# Patient Record
Sex: Female | Born: 1960
Health system: Southern US, Community
[De-identification: ages and names within clinical notes are randomized; demographics above are authoritative.]

## PROBLEM LIST (undated history)

## (undated) DIAGNOSIS — I10 Essential (primary) hypertension: Secondary | ICD-10-CM

## (undated) DIAGNOSIS — J45909 Unspecified asthma, uncomplicated: Secondary | ICD-10-CM

## (undated) DIAGNOSIS — I340 Nonrheumatic mitral (valve) insufficiency: Secondary | ICD-10-CM

## (undated) HISTORY — DX: Unspecified asthma, uncomplicated: J45.909

## (undated) HISTORY — PX: MOUTH SURGERY: SHX715

## (undated) HISTORY — DX: Nonrheumatic mitral (valve) insufficiency: I34.0

---

## 2017-07-10 DIAGNOSIS — J302 Other seasonal allergic rhinitis: Secondary | ICD-10-CM | POA: Insufficient documentation

## 2017-09-07 ENCOUNTER — Encounter (HOSPITAL_COMMUNITY): Payer: Self-pay | Admitting: Emergency Medicine

## 2017-09-07 ENCOUNTER — Inpatient Hospital Stay (HOSPITAL_COMMUNITY)
Admission: EM | Admit: 2017-09-07 | Discharge: 2017-09-10 | DRG: 202 | Disposition: A | Discharge status: Home / Self Care | Payer: No Typology Code available for payment source | Attending: Internal Medicine | Admitting: Internal Medicine

## 2017-09-07 ENCOUNTER — Other Ambulatory Visit: Payer: Self-pay

## 2017-09-07 DIAGNOSIS — R600 Localized edema: Secondary | ICD-10-CM | POA: Diagnosis present

## 2017-09-07 DIAGNOSIS — E669 Obesity, unspecified: Secondary | ICD-10-CM | POA: Diagnosis present

## 2017-09-07 DIAGNOSIS — J9801 Acute bronchospasm: Secondary | ICD-10-CM

## 2017-09-07 DIAGNOSIS — I119 Hypertensive heart disease without heart failure: Secondary | ICD-10-CM | POA: Diagnosis present

## 2017-09-07 DIAGNOSIS — I110 Hypertensive heart disease with heart failure: Secondary | ICD-10-CM | POA: Diagnosis present

## 2017-09-07 DIAGNOSIS — I10 Essential (primary) hypertension: Secondary | ICD-10-CM

## 2017-09-07 DIAGNOSIS — J9601 Acute respiratory failure with hypoxia: Secondary | ICD-10-CM | POA: Diagnosis present

## 2017-09-07 DIAGNOSIS — J209 Acute bronchitis, unspecified: Secondary | ICD-10-CM | POA: Diagnosis present

## 2017-09-07 DIAGNOSIS — K219 Gastro-esophageal reflux disease without esophagitis: Secondary | ICD-10-CM | POA: Diagnosis present

## 2017-09-07 DIAGNOSIS — I2721 Secondary pulmonary arterial hypertension: Secondary | ICD-10-CM | POA: Diagnosis present

## 2017-09-07 DIAGNOSIS — R0602 Shortness of breath: Secondary | ICD-10-CM

## 2017-09-07 DIAGNOSIS — T380X5A Adverse effect of glucocorticoids and synthetic analogues, initial encounter: Secondary | ICD-10-CM | POA: Diagnosis not present

## 2017-09-07 DIAGNOSIS — D72829 Elevated white blood cell count, unspecified: Secondary | ICD-10-CM | POA: Diagnosis not present

## 2017-09-07 DIAGNOSIS — J45901 Unspecified asthma with (acute) exacerbation: Secondary | ICD-10-CM | POA: Diagnosis present

## 2017-09-07 DIAGNOSIS — Z6833 Body mass index (BMI) 33.0-33.9, adult: Secondary | ICD-10-CM

## 2017-09-07 DIAGNOSIS — E876 Hypokalemia: Secondary | ICD-10-CM | POA: Diagnosis present

## 2017-09-07 DIAGNOSIS — Z825 Family history of asthma and other chronic lower respiratory diseases: Secondary | ICD-10-CM

## 2017-09-07 DIAGNOSIS — I5031 Acute diastolic (congestive) heart failure: Secondary | ICD-10-CM | POA: Diagnosis present

## 2017-09-07 HISTORY — DX: Essential (primary) hypertension: I10

## 2017-09-07 NOTE — ED Triage Notes (Signed)
Pt presents from home with cough and shortness of breath that began getting worse tonight. Pt reports to being dx with cold induced asthma and prescribed inhalers. Pt reports no relief with home medications. Audible wheezing noted at time of triage. Pt actively receiving second DuoNeb started by EMS.

## 2017-09-07 NOTE — ED Notes (Signed)
Bed: [id number] Expected date:  Expected time:  Means of arrival:  Comments: SOB, cough

## 2017-09-08 ENCOUNTER — Emergency Department (HOSPITAL_COMMUNITY): Payer: No Typology Code available for payment source

## 2017-09-08 ENCOUNTER — Inpatient Hospital Stay (HOSPITAL_COMMUNITY): Payer: No Typology Code available for payment source

## 2017-09-08 ENCOUNTER — Encounter (HOSPITAL_COMMUNITY): Payer: Self-pay | Admitting: Internal Medicine

## 2017-09-08 DIAGNOSIS — E669 Obesity, unspecified: Secondary | ICD-10-CM | POA: Diagnosis present

## 2017-09-08 DIAGNOSIS — J45901 Unspecified asthma with (acute) exacerbation: Secondary | ICD-10-CM | POA: Diagnosis present

## 2017-09-08 DIAGNOSIS — I119 Hypertensive heart disease without heart failure: Secondary | ICD-10-CM | POA: Diagnosis present

## 2017-09-08 DIAGNOSIS — R600 Localized edema: Secondary | ICD-10-CM | POA: Diagnosis present

## 2017-09-08 DIAGNOSIS — Z6833 Body mass index (BMI) 33.0-33.9, adult: Secondary | ICD-10-CM | POA: Diagnosis not present

## 2017-09-08 DIAGNOSIS — J9601 Acute respiratory failure with hypoxia: Secondary | ICD-10-CM | POA: Diagnosis present

## 2017-09-08 DIAGNOSIS — J209 Acute bronchitis, unspecified: Principal | ICD-10-CM

## 2017-09-08 DIAGNOSIS — J9801 Acute bronchospasm: Secondary | ICD-10-CM | POA: Diagnosis not present

## 2017-09-08 DIAGNOSIS — I10 Essential (primary) hypertension: Secondary | ICD-10-CM | POA: Diagnosis not present

## 2017-09-08 DIAGNOSIS — J201 Acute bronchitis due to Hemophilus influenzae: Secondary | ICD-10-CM | POA: Diagnosis not present

## 2017-09-08 DIAGNOSIS — Z825 Family history of asthma and other chronic lower respiratory diseases: Secondary | ICD-10-CM | POA: Diagnosis not present

## 2017-09-08 DIAGNOSIS — K219 Gastro-esophageal reflux disease without esophagitis: Secondary | ICD-10-CM | POA: Diagnosis present

## 2017-09-08 DIAGNOSIS — I2721 Secondary pulmonary arterial hypertension: Secondary | ICD-10-CM | POA: Diagnosis present

## 2017-09-08 DIAGNOSIS — I5031 Acute diastolic (congestive) heart failure: Secondary | ICD-10-CM | POA: Diagnosis present

## 2017-09-08 DIAGNOSIS — E876 Hypokalemia: Secondary | ICD-10-CM | POA: Diagnosis present

## 2017-09-08 DIAGNOSIS — D72829 Elevated white blood cell count, unspecified: Secondary | ICD-10-CM | POA: Diagnosis not present

## 2017-09-08 DIAGNOSIS — T380X5A Adverse effect of glucocorticoids and synthetic analogues, initial encounter: Secondary | ICD-10-CM | POA: Diagnosis not present

## 2017-09-08 DIAGNOSIS — I110 Hypertensive heart disease with heart failure: Secondary | ICD-10-CM | POA: Diagnosis present

## 2017-09-08 DIAGNOSIS — R0600 Dyspnea, unspecified: Secondary | ICD-10-CM | POA: Diagnosis not present

## 2017-09-08 LAB — BASIC METABOLIC PANEL
ANION GAP: 8 (ref 5–15)
ANION GAP: 9 (ref 5–15)
BUN: 10 mg/dL (ref 6–20)
BUN: 11 mg/dL (ref 6–20)
CALCIUM: 8.8 mg/dL — AB (ref 8.9–10.3)
CHLORIDE: 105 mmol/L (ref 101–111)
CHLORIDE: 105 mmol/L (ref 101–111)
CO2: 25 mmol/L (ref 22–32)
CO2: 26 mmol/L (ref 22–32)
CREATININE: 0.66 mg/dL (ref 0.44–1.00)
Calcium: 9.5 mg/dL (ref 8.9–10.3)
Creatinine, Ser: 0.65 mg/dL (ref 0.44–1.00)
GFR calc Af Amer: >60 mL/min (ref >60)
GFR calc non Af Amer: >60 mL/min (ref >60)
GLUCOSE: 133 mg/dL — AB (ref 65–99)
Glucose, Bld: 252 mg/dL — ABNORMAL HIGH (ref 65–99)
POTASSIUM: 3.2 mmol/L — AB (ref 3.5–5.1)
Potassium: 3.2 mmol/L — ABNORMAL LOW (ref 3.5–5.1)
SODIUM: 139 mmol/L (ref 135–145)
Sodium: 139 mmol/L (ref 135–145)

## 2017-09-08 LAB — BLOOD GAS, ARTERIAL
Acid-base deficit: 2.2 mmol/L — ABNORMAL HIGH (ref 0.0–2.0)
Bicarbonate: 23.5 mmol/L (ref 20.0–28.0)
Drawn by: 232811
FIO2: 21
O2 Saturation: 87.1 %
PATIENT TEMPERATURE: 98
PCO2 ART: 46.1 mmHg (ref 32.0–48.0)
PH ART: 7.326 — AB (ref 7.350–7.450)
PO2 ART: 57.1 mmHg — AB (ref 83.0–108.0)

## 2017-09-08 LAB — CBC WITH DIFFERENTIAL/PLATELET
Basophils Absolute: 0.1 10*3/uL (ref 0.0–0.1)
Basophils Relative: 0 %
EOS PCT: 1 %
Eosinophils Absolute: 0.2 10*3/uL (ref 0.0–0.7)
HEMATOCRIT: 37.9 % (ref 36.0–46.0)
Hemoglobin: 12.1 g/dL (ref 12.0–15.0)
LYMPHS ABS: 1.4 10*3/uL (ref 0.7–4.0)
LYMPHS PCT: 10 %
MCH: 29.1 pg (ref 26.0–34.0)
MCHC: 31.9 g/dL (ref 30.0–36.0)
MCV: 91.1 fL (ref 78.0–100.0)
MONO ABS: 0.6 10*3/uL (ref 0.1–1.0)
MONOS PCT: 4 %
NEUTROS ABS: 11.8 10*3/uL — AB (ref 1.7–7.7)
Neutrophils Relative %: 85 %
PLATELETS: 360 10*3/uL (ref 150–400)
RBC: 4.16 MIL/uL (ref 3.87–5.11)
RDW: 13.6 % (ref 11.5–15.5)
WBC: 14 10*3/uL — ABNORMAL HIGH (ref 4.0–10.5)

## 2017-09-08 LAB — RESPIRATORY PANEL BY PCR
ADENOVIRUS-RVPPCR: NOT DETECTED
Bordetella pertussis: NOT DETECTED
CORONAVIRUS HKU1-RVPPCR: NOT DETECTED
CORONAVIRUS NL63-RVPPCR: NOT DETECTED
CORONAVIRUS OC43-RVPPCR: NOT DETECTED
Chlamydophila pneumoniae: NOT DETECTED
Coronavirus 229E: NOT DETECTED
Influenza A: NOT DETECTED
Influenza B: NOT DETECTED
METAPNEUMOVIRUS-RVPPCR: NOT DETECTED
Mycoplasma pneumoniae: NOT DETECTED
PARAINFLUENZA VIRUS 2-RVPPCR: NOT DETECTED
PARAINFLUENZA VIRUS 3-RVPPCR: NOT DETECTED
Parainfluenza Virus 1: NOT DETECTED
Parainfluenza Virus 4: NOT DETECTED
RHINOVIRUS / ENTEROVIRUS - RVPPCR: NOT DETECTED
Respiratory Syncytial Virus: NOT DETECTED

## 2017-09-08 LAB — CBC
HCT: 34.9 % — ABNORMAL LOW (ref 36.0–46.0)
Hemoglobin: 11.1 g/dL — ABNORMAL LOW (ref 12.0–15.0)
MCH: 28.9 pg (ref 26.0–34.0)
MCHC: 31.8 g/dL (ref 30.0–36.0)
MCV: 90.9 fL (ref 78.0–100.0)
PLATELETS: 345 10*3/uL (ref 150–400)
RBC: 3.84 MIL/uL — AB (ref 3.87–5.11)
RDW: 13.7 % (ref 11.5–15.5)
WBC: 15.8 10*3/uL — AB (ref 4.0–10.5)

## 2017-09-08 LAB — MRSA PCR SCREENING: MRSA BY PCR: NEGATIVE

## 2017-09-08 LAB — I-STAT TROPONIN, ED: Troponin i, poc: 0 ng/mL (ref 0.00–0.08)

## 2017-09-08 LAB — D-DIMER, QUANTITATIVE (NOT AT ARMC): D DIMER QUANT: 2.76 ug{FEU}/mL — AB (ref 0.00–0.50)

## 2017-09-08 LAB — MAGNESIUM: Magnesium: 2.1 mg/dL (ref 1.7–2.4)

## 2017-09-08 LAB — TROPONIN I: Troponin I: <0.03 ng/mL (ref <0.03)

## 2017-09-08 LAB — BRAIN NATRIURETIC PEPTIDE: B Natriuretic Peptide: 60 pg/mL (ref 0.0–100.0)

## 2017-09-08 MED ORDER — BUDESONIDE 0.25 MG/2ML IN SUSP
0.2500 mg | Freq: Two times a day (BID) | RESPIRATORY_TRACT | Status: DC
  Administered 2017-09-08 – 2017-09-09 (×2): 0.25 mg via RESPIRATORY_TRACT
  Filled 2017-09-08 (×3): qty 2

## 2017-09-08 MED ORDER — ALBUTEROL (5 MG/ML) CONTINUOUS INHALATION SOLN
10.0000 mg/h | INHALATION_SOLUTION | RESPIRATORY_TRACT | Status: DC
  Administered 2017-09-08: 10 mg/h via RESPIRATORY_TRACT
  Filled 2017-09-08: qty 20

## 2017-09-08 MED ORDER — IOPAMIDOL (ISOVUE-370) INJECTION 76%
INTRAVENOUS | Status: AC
  Administered 2017-09-08: 100 mL via INTRAVENOUS
  Filled 2017-09-08: qty 100

## 2017-09-08 MED ORDER — METHYLPREDNISOLONE SODIUM SUCC 40 MG IJ SOLR
40.0000 mg | Freq: Two times a day (BID) | INTRAMUSCULAR | Status: DC
  Administered 2017-09-08 (×2): 40 mg via INTRAVENOUS
  Filled 2017-09-08 (×3): qty 1

## 2017-09-08 MED ORDER — ACETAMINOPHEN 325 MG PO TABS: 650.0000 mg | ORAL_TABLET | Freq: Four times a day (QID) | ORAL | Status: DC | PRN

## 2017-09-08 MED ORDER — ONDANSETRON HCL 4 MG/2ML IJ SOLN
4.0000 mg | Freq: Four times a day (QID) | INTRAMUSCULAR | Status: DC | PRN
  Administered 2017-09-08: 4 mg via INTRAVENOUS
  Filled 2017-09-08: qty 2

## 2017-09-08 MED ORDER — IOPAMIDOL (ISOVUE-370) INJECTION 76%
100.0000 mL | Freq: Once | INTRAVENOUS | Status: AC | PRN
  Administered 2017-09-08: 100 mL via INTRAVENOUS

## 2017-09-08 MED ORDER — ALBUTEROL SULFATE (2.5 MG/3ML) 0.083% IN NEBU
2.5000 mg | INHALATION_SOLUTION | RESPIRATORY_TRACT | Status: DC | PRN
  Administered 2017-09-08: 2.5 mg via RESPIRATORY_TRACT
  Filled 2017-09-08: qty 3

## 2017-09-08 MED ORDER — ALBUTEROL SULFATE (2.5 MG/3ML) 0.083% IN NEBU
2.5000 mg | INHALATION_SOLUTION | Freq: Four times a day (QID) | RESPIRATORY_TRACT | Status: DC
  Filled 2017-09-08: qty 3

## 2017-09-08 MED ORDER — LORAZEPAM 2 MG/ML IJ SOLN
0.5000 mg | Freq: Once | INTRAMUSCULAR | Status: DC
  Filled 2017-09-08: qty 1

## 2017-09-08 MED ORDER — METOPROLOL TARTRATE 5 MG/5ML IV SOLN
5.0000 mg | Freq: Four times a day (QID) | INTRAVENOUS | Status: DC | PRN
Start: 2017-09-08 — End: 2017-09-10

## 2017-09-08 MED ORDER — METHYLPREDNISOLONE SODIUM SUCC 125 MG IJ SOLR
125.0000 mg | Freq: Once | INTRAMUSCULAR | Status: AC
  Administered 2017-09-08: 125 mg via INTRAVENOUS
  Filled 2017-09-08: qty 2

## 2017-09-08 MED ORDER — BENZONATATE 100 MG PO CAPS
100.0000 mg | ORAL_CAPSULE | Freq: Three times a day (TID) | ORAL | Status: DC | PRN
  Administered 2017-09-09 (×2): 100 mg via ORAL
  Filled 2017-09-08 (×2): qty 1

## 2017-09-08 MED ORDER — LISINOPRIL 20 MG PO TABS
20.0000 mg | ORAL_TABLET | Freq: Every day | ORAL | Status: DC
  Administered 2017-09-08 – 2017-09-10 (×3): 20 mg via ORAL
  Filled 2017-09-08 (×2): qty 1
  Filled 2017-09-08 (×2): qty 2
  Filled 2017-09-08: qty 1
  Filled 2017-09-08: qty 2

## 2017-09-08 MED ORDER — LORAZEPAM 2 MG/ML IJ SOLN
1.0000 mg | INTRAMUSCULAR | Status: DC | PRN
  Administered 2017-09-09: 1 mg via INTRAVENOUS
  Filled 2017-09-08: qty 1

## 2017-09-08 MED ORDER — DEXTROSE 5 % IV SOLN
500.0000 mg | INTRAVENOUS | Status: DC
  Administered 2017-09-08 – 2017-09-10 (×3): 500 mg via INTRAVENOUS
  Filled 2017-09-08 (×2): qty 500

## 2017-09-08 MED ORDER — LORAZEPAM 2 MG/ML IJ SOLN
0.5000 mg | Freq: Once | INTRAMUSCULAR | Status: AC
  Administered 2017-09-08: 0.5 mg via INTRAVENOUS

## 2017-09-08 MED ORDER — POTASSIUM CHLORIDE CRYS ER 20 MEQ PO TBCR
40.0000 meq | EXTENDED_RELEASE_TABLET | Freq: Once | ORAL | Status: AC
  Administered 2017-09-08: 40 meq via ORAL
  Filled 2017-09-08: qty 2

## 2017-09-08 MED ORDER — LEVALBUTEROL HCL 0.63 MG/3ML IN NEBU
0.6300 mg | INHALATION_SOLUTION | Freq: Four times a day (QID) | RESPIRATORY_TRACT | Status: DC
  Administered 2017-09-08 – 2017-09-09 (×4): 0.63 mg via RESPIRATORY_TRACT
  Filled 2017-09-08 (×5): qty 3

## 2017-09-08 MED ORDER — ALBUTEROL SULFATE (2.5 MG/3ML) 0.083% IN NEBU: 2.5000 mg | INHALATION_SOLUTION | RESPIRATORY_TRACT | Status: DC

## 2017-09-08 MED ORDER — ACETAMINOPHEN 650 MG RE SUPP
650.0000 mg | Freq: Four times a day (QID) | RECTAL | Status: DC | PRN
Start: 2017-09-08 — End: 2017-09-10

## 2017-09-08 MED ORDER — MAGNESIUM SULFATE 2 GM/50ML IV SOLN
2.0000 g | Freq: Once | INTRAVENOUS | Status: AC
  Administered 2017-09-08: 2 g via INTRAVENOUS
  Filled 2017-09-08: qty 50

## 2017-09-08 MED ORDER — ONDANSETRON HCL 4 MG PO TABS
4.0000 mg | ORAL_TABLET | Freq: Four times a day (QID) | ORAL | Status: DC | PRN
Start: 2017-09-08 — End: 2017-09-10

## 2017-09-08 MED ORDER — HYDRALAZINE HCL 20 MG/ML IJ SOLN
10.0000 mg | INTRAMUSCULAR | Status: DC | PRN
  Administered 2017-09-08: 10 mg via INTRAVENOUS
  Filled 2017-09-08: qty 1

## 2017-09-08 MED ORDER — ENOXAPARIN SODIUM 40 MG/0.4ML ~~LOC~~ SOLN
40.0000 mg | SUBCUTANEOUS | Status: DC
  Administered 2017-09-09 – 2017-09-10 (×2): 40 mg via SUBCUTANEOUS
  Filled 2017-09-08 (×2): qty 0.4

## 2017-09-08 NOTE — ED Notes (Signed)
Assigned 1230 @ 3:51

## 2017-09-08 NOTE — ED Notes (Signed)
Pt returned from restroom via steady and still very anxious and pt reporting feeling jittery. Dr.Pollina at bedside and reordered Ativan 0.5mg  IVP for anxiety. Pt also placed on 2L O2 via [place] to help with anxiety.

## 2017-09-08 NOTE — ED Notes (Signed)
Report given to [place]Kara on 2W. Pt will be transported following CT

## 2017-09-08 NOTE — Progress Notes (Addendum)
Pt admitted after midnight, please see earlier admission note by Dr. Toniann FailKakrakandy. Patient admitted with acute respiratory failure with hypoxia. Admitted to SDU, PCCM consulted. Currently feels better. CT angio pending, r/o PE. Contineu bronchodilators scheduled as needed, solumedrol 40 mg IV BID, Zithromax 500 mg IV QD. RVP also added.   Debbora PrestoMAGICK-Lenee Franze, MD  [facility] Pager [phone]  If 7PM-7AM, please contact night-coverage [email or web address] Password [email or web address]

## 2017-09-08 NOTE — ED Notes (Addendum)
Pt placed on hospital bed from ER stretcher. Dr.Kakrakandy notified of pt still having difficulty breathing and anxiety.

## 2017-09-08 NOTE — ED Notes (Signed)
Pt still reporting shortness of breath. After results of ABG pt placed on O2 via [place] at 3L. Dr.Kakrakandy aware.

## 2017-09-08 NOTE — Progress Notes (Signed)
This RT placed pt on 3lnc post abg.  Results show PO2=57, RN notified.

## 2017-09-08 NOTE — Consult Note (Signed)
.. ..  Name: Jillian Jefferson MRN: [id number] DOB: 12/23/1960    ADMISSION DATE:  09/07/2017 CONSULTATION DATE:  09/08/17   REFERRING MD :  Toniann FailKakrakandy MD   CHIEF COMPLAINT:  Shortness of Breath   BRIEF PATIENT DESCRIPTION: 56 yr old female with PMHx of HTN presents with   SIGNIFICANT EVENTS   STUDIES:  CXR ABG  HISTORY OF PRESENT ILLNESS:     56 yr old female never smoker with PMHx of HTN started Norvasc recently (a week ago) and within one day started to notice breathing difficulty. She saw a primary care doctor for this problem who continued her norvasc and added an inhaler which provided no relief so the patient stopped the norvasc. She does not use home oxygen at baseline and denies any history of breathing problems prior to this event. She states that three days ago the breathing got much worse. She admits to being immobile for several days due to the inclement weather. She has not had any recent surgeries and denies leg pain, fever and previous history of clotting/bleeding disorders and malignancy.  She does state that her shortness of breath is accompanied by productive cough ( yellowish sputum sometimes blood streaked) Chills but no documented fevers at home.   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  has a past medical history of Hypertension.  has a past surgical history that includes Mouth surgery. Prior to Admission medications   Medication Sig Start Date End Date Taking? Authorizing Provider  albuterol (PROVENTIL HFA;VENTOLIN HFA) 108 (90 Base) MCG/ACT inhaler Inhale 2 puffs into the lungs every 6 (six) hours as needed for wheezing or shortness of breath.   Yes [provider]  azithromycin (ZITHROMAX) 250 MG tablet Take 250 mg by mouth daily. 09/04/17 09/10/17 Yes [provider]  cetirizine (ZYRTEC) 10 MG  tablet Take 10 mg by mouth daily.   Yes [provider]  cyclobenzaprine (FLEXERIL) 10 MG tablet Take 10 mg by mouth 3 (three) times daily as needed for muscle spasms.   Yes [provider]  Glycopyrrolate-Formoterol (BEVESPI AEROSPHERE) 9-4.8 MCG/ACT AERO Inhale 2 puffs into the lungs 2 (two) times daily.   Yes [provider]  ibuprofen (ADVIL,MOTRIN) 800 MG tablet Take 800 mg by mouth every 8 (eight) hours as needed for moderate pain.   Yes [provider]  lisinopril-hydrochlorothiazide (PRINZIDE,ZESTORETIC) 20-12.5 MG tablet Take 1 tablet by mouth daily.   Yes [provider]  Multiple Vitamin (MULTIVITAMIN WITH MINERALS) TABS tablet Take 1 tablet by mouth daily.   Yes [provider]  OVER THE COUNTER MEDICATION Take 1 capsule by mouth 3 (three) times daily. Bio-X4   Yes [provider]   No Known Allergies  FAMILY HISTORY:  family history includes Hypertension in her other.  Mother and Aunt both Breast CA  SOCIAL HISTORY:  reports that  has never smoked. she has never used smokeless tobacco. She reports that she does not drink alcohol or use drugs.  REVIEW OF SYSTEMS:  Bolded items are pertinent positives Constitutional: Negative for fever, chills, weight loss, malaise/fatigue and diaphoresis.  HENT: Negative for hearing loss, ear paain, nosebleeds, congestion, sore throat, neck pain, tinnitus and ear discharge.   Eyes: Negative for blurred vision, double vision, photophobia, pain, discharge and redness.  Respiratory: Negative for cough, hemoptysis, sputum production, shortness of breath, wheezing  and stridor.   Cardiovascular: Negative for chest pain, palpitations, orthopnea, claudication, leg swelling and PND.  Gastrointestinal: Negative for heartburn, nausea, vomiting, abdominal pain, diarrhea, constipation, blood in stool and melena.  Genitourinary: Negative for dysuria, urgency, frequency, hematuria and flank pain.    Musculoskeletal: Negative for myalgias, back pain, joint pain and falls.  Skin: Negative for itching and rash.  Neurological: Negative for dizziness, tingling, tremors, sensory change, speech change, focal weakness, seizures, loss of consciousness, weakness and headaches.  Endo/Heme/Allergies: Negative for environmental allergies and polydipsia. Does not bruise/bleed easily.  SUBJECTIVE:   VITAL SIGNS: Temp:  [98 F (36.7 C)] 98 F (36.7 C) (12/10 2348) Pulse Rate:  [109-137] 124 (12/11 0600) Resp:  [12-51] 22 (12/11 0600) BP: (139-193)/(68-108) 183/96 (12/11 0600) SpO2:  [91 %-100 %] 99 % (12/11 0600) Weight:  [86.2 kg (190 lb)] 86.2 kg (190 lb) (12/10 2351)  PHYSICAL EXAMINATION: General:  Alert in mild discomfort Neuro:  AAOx3 no focal deficits appreciated   HEENT:  NCAT PERRLA Cardiovascular:  S1 and S2 tachy  Lungs:  Coarse rhonic esp in posterior fields with exp wheeze appreciated  Abdomen:  Soft non tender + BS  Musculoskeletal:  +2 edema pitting in lower ext Skin:  intact with no rash  Recent Labs  Lab 09/08/17 0001 09/08/17 0508  NA 139 139  K 3.2* 3.2*  CL 105 105  CO2 26 25  BUN 11 10  CREATININE 0.65 0.66  GLUCOSE 133* 252*   Recent Labs  Lab 09/08/17 0001 09/08/17 0508  HGB 12.1 11.1*  HCT 37.9 34.9*  WBC 14.0* 15.8*  PLT 360 345   Dg Chest Port 1 View  Result Date: 09/08/2017 CLINICAL DATA:  56 y/o  F; shortness of breath, cough, wheezing. EXAM: PORTABLE CHEST 1 VIEW COMPARISON:  11/04/2016 chest radiograph FINDINGS: Stable heart size and mediastinal contours are within normal limits. Both lungs are clear. The visualized skeletal structures are unremarkable. IMPRESSION: No active disease. Electronically Signed   By: Mitzi HansenLance  Furusawa-Stratton M.D.   On: 09/08/2017 00:57    ASSESSMENT / PLAN: 56 year old female with one week history of dyspnea. Consulted for PCCM evaluation Pt is hemodynamically stable and currently requiring 4L [place] of  O2   Recommendations: Hypoxia/Hypoxemia Concerning as it is new Titrate O2 to keep Sats >92% If at 8-10L consider HFNC switch bronchodilator to Xopenex due to tachycardia  Given acute nature, immobility, tachycardia and desaturation Concern for PE Sent for D-dimer as Well's score intermediate probability Came back elevated at 2.76 recommend CT PE w/ and w/o contrast  Lower extremity pitting edema concerning BNP was only 60 ( can be inaccurate in obese patients) 2D ECHO given h/o uncontrolled HTN Pt was taking OTC cold meds which may be blamed as reason for elevated BP  R/O Infectious etiology Bronchospasm noted on exam On proventil but should be switched to xopenex due to HR Resp viral panel  Sputum and blood cx sent    At this time she does not meet criteria for the ICU But if her condition worsens please feel free to reconsult   I, Dr Newell CoralKristen Stashia Sia have personally reviewed patient's available data, including medical history, events of note, physical examination and test results as part of my evaluation. I have discussed with care providers Hospitalist, Elink and RN The patient's condition requires high complexity decision making for assessment and support, frequent evaluation and titration of therapies, application of advanced monitoring technologies and extensive interpretation of multiple databases.  This critical care time of 45 mins does not reflect procedure time, or teaching time or supervisory time of PA but could involve care discussion time    CC TIME: 45 mins PROGNOSIS: Guarded FAMILY: Husband CODE STATUS: Full DISPOSITION: Hospitalist Stepdown   Signed Dr Newell Coral Pulmonary Critical Care Locums Pulmonary and Critical Care Medicine [facility] Pager: [phone]  09/08/2017, 6:21 AM

## 2017-09-08 NOTE — ED Notes (Signed)
Pt requesting medication to aid with anxiety. Provider to be notified.

## 2017-09-08 NOTE — ED Provider Notes (Signed)
Patient presented to the ER with shortness of breath.  Face to face Exam: HEENT - PERRLA Lungs -tachypnea, bilateral wheezing Heart - RRR, no M/R/G Abd - S/NT/ND Neuro - alert, oriented x3  Plan: Patient with history of asthma presents to the emergency department with increased shortness of breath and wheezing.  She has only partially responded to maximum therapy, will hospitalize for further management.   Gilda CreasePollina, Angelic Schnelle J, MD 09/08/17 0157

## 2017-09-08 NOTE — ED Notes (Signed)
Per hospitalist, albuterol is to be held at this time due to bp and hr

## 2017-09-08 NOTE — ED Notes (Signed)
Pt still exhibiting shortness of breath and wheezing at this time. Pt reports some ease after receiving Ativan earlier.

## 2017-09-08 NOTE — ED Notes (Signed)
Dr.Scatliffe at bedside to evaluate pt.

## 2017-09-08 NOTE — ED Provider Notes (Signed)
[facility]-EMERGENCY DEPT Provider Note   CSN: [id number] Arrival date & time: 09/07/17  2338     History   Chief Complaint Chief Complaint  Patient presents with  . Shortness of Breath    HPI Jillian Jefferson is a 56 y.o. female.  The history is provided by the patient and medical records.  Shortness of Breath  Associated symptoms include cough.    56 y.o. F with hx of HTN, presenting to the ED for SOB.  Patient has been sick for about a week now, saw her doctor 2 days ago and was started on azithromycin and some home inhalers.  She was diagnosed with cold induced asthma.  Husband reports she got worse about 3 hours prior to arrival.  She has had 2 duonebs with EMS without much change.  Continues to feel SOB with wet but non-productive cough.  No fever, chills.  No known sick contacts.  Did recently start taking norvasc recently as well-- prescribed about 3 weeks ago by her PCP.  Past Medical History:  Diagnosis Date  . Hypertension     There are no active problems to display for this patient.   Past Surgical History:  Procedure Laterality Date  . MOUTH SURGERY      OB History    No data available       Home Medications    Prior to Admission medications   Not on File    Family History History reviewed. No pertinent family history.  Social History Social History   Tobacco Use  . Smoking status: Never Smoker  . Smokeless tobacco: Never Used  Substance Use Topics  . Alcohol use: No    Frequency: Never  . Drug use: No     Allergies   Patient has no known allergies.   Review of Systems Review of Systems  Respiratory: Positive for cough and shortness of breath.   All other systems reviewed and are negative.    Physical Exam Updated Vital Signs BP (!) 187/85 (BP Location: Right Arm)   Pulse (!) 117   Temp 98 F (36.7 C) (Oral)   Resp 12   Ht 5\' 4"  (1.626 m)   Wt 86.2 kg (190 lb)   SpO2 100%   BMI 32.61 kg/m    Physical Exam  Constitutional: She is oriented to person, place, and time. She appears well-developed and well-nourished.  HENT:  Head: Normocephalic and atraumatic.  Mouth/Throat: Oropharynx is clear and moist.  Airway clear, no stridor  Eyes: Conjunctivae and EOM are normal. Pupils are equal, round, and reactive to light.  Neck: Normal range of motion.  Cardiovascular: Regular rhythm and normal heart sounds. Tachycardia present.  Pulmonary/Chest: Accessory muscle usage present. Tachypnea noted. She has wheezes. She has no rales.  tachypneic with increased work of breathing, increased effort with accessory muscle use; retractions present; diffuse inspiratory and expiratory wheezes throughout with some intermixed rhonchi; speaking in very short, truncated sentences  Abdominal: Soft. Bowel sounds are normal.  Musculoskeletal: Normal range of motion.  Neurological: She is alert and oriented to person, place, and time.  Skin: Skin is warm and dry.  Psychiatric: She has a normal mood and affect.  Nursing note and vitals reviewed.    ED Treatments / Results  Labs (all labs ordered are listed, but only abnormal results are displayed) Labs Reviewed  CBC WITH DIFFERENTIAL/PLATELET - Abnormal; Notable for the following components:      Result Value   WBC 14.0 (*)  Neutro Abs 11.8 (*)    All other components within normal limits  BASIC METABOLIC PANEL - Abnormal; Notable for the following components:   Potassium 3.2 (*)    Glucose, Bld 133 (*)    All other components within normal limits  HIV ANTIBODY (ROUTINE TESTING)  BASIC METABOLIC PANEL  CBC  CBC  CREATININE, SERUM  I-STAT TROPONIN, ED    EKG  EKG Interpretation  Date/Time:  Monday September 07 2017 23:54:02 EST Ventricular Rate:  117 PR Interval:    QRS Duration: 85 QT Interval:  336 QTC Calculation: 469 R Axis:   -49 Text Interpretation:  Sinus tachycardia Left anterior fascicular block Abnormal R-wave  progression, early transition No previous tracing Confirmed by Gilda CreasePollina, Christopher J [phone]) on 09/08/2017 12:14:15 AM       Radiology Dg Chest Port 1 View  Result Date: 09/08/2017 CLINICAL DATA:  56 y/o  F; shortness of breath, cough, wheezing. EXAM: PORTABLE CHEST 1 VIEW COMPARISON:  11/04/2016 chest radiograph FINDINGS: Stable heart size and mediastinal contours are within normal limits. Both lungs are clear. The visualized skeletal structures are unremarkable. IMPRESSION: No active disease. Electronically Signed   By: Mitzi HansenLance  Furusawa-Stratton M.D.   On: 09/08/2017 00:57    Procedures Procedures (including critical care time)  CRITICAL CARE Performed by: Garlon HatchetLisa M Akeen Ledyard   Total critical care time: 45 minutes  Critical care time was exclusive of separately billable procedures and treating other patients.  Critical care was necessary to treat or prevent imminent or life-threatening deterioration.  Critical care was time spent personally by me on the following activities: development of treatment plan with patient and/or surrogate as well as nursing, discussions with consultants, evaluation of patient's response to treatment, examination of patient, obtaining history from patient or surrogate, ordering and performing treatments and interventions, ordering and review of laboratory studies, ordering and review of radiographic studies, pulse oximetry and re-evaluation of patient's condition.   Medications Ordered in ED Medications  acetaminophen (TYLENOL) tablet 650 mg (not administered)    Or  acetaminophen (TYLENOL) suppository 650 mg (not administered)  ondansetron (ZOFRAN) tablet 4 mg (not administered)    Or  ondansetron (ZOFRAN) injection 4 mg (not administered)  enoxaparin (LOVENOX) injection 40 mg (not administered)  albuterol (PROVENTIL) (2.5 MG/3ML) 0.083% nebulizer solution 2.5 mg (not administered)  budesonide (PULMICORT) nebulizer solution 0.25 mg (not administered)   methylPREDNISolone sodium succinate (SOLU-MEDROL) 40 mg/mL injection 40 mg (not administered)  albuterol (PROVENTIL) (2.5 MG/3ML) 0.083% nebulizer solution 2.5 mg (not administered)  methylPREDNISolone sodium succinate (SOLU-MEDROL) 125 mg/2 mL injection 125 mg (125 mg Intravenous Given 09/08/17 0026)  magnesium sulfate IVPB 2 g 50 mL (0 g Intravenous Stopped 09/08/17 0125)  potassium chloride SA (K-DUR,KLOR-CON) CR tablet 40 mEq (40 mEq Oral Given 09/08/17 0151)  LORazepam (ATIVAN) injection 0.5 mg (0.5 mg Intravenous Given 09/08/17 0156)     Initial Impression / Assessment and Plan / ED Course  I have reviewed the triage vital signs and the nursing notes.  Pertinent labs & imaging results that were available during my care of the patient were reviewed by me and considered in my medical decision making (see chart for details).  56 year old female here with shortness of breath.  Recent URI type symptoms, started on azithromycin but also diagnosed with cold-induced asthma.  Has not had any relief with home inhaler.  On initial exam patient is tachypneic, tachycardic, with labored breathing.  She has diffuse inspiratory and expiratory wheezes.  Currently on second DuoNeb.  Will start on hour-long continuous neb, give IV Solu-Medrol and magnesium.  Plan for basic labs, chest x-ray.  1:29 AM Patient has completed an hour long neb--minor improvement.  She still has diffuse expiratory wheezes.  States she is now feeling somewhat panicky because of her breathing as she has never experienced anything like this in the past.  She still appears labored.  She only has access to albuterol inhaler at home, no nebs.  She will require admission for ongoing treatments and likely steroids.  She was given small dose ativan for anxiety.  Discussed labs and CXR with patient and family, they are in agreement with admission.  Discussed with Dr. Toniann FailKakrakandy-- will admit for ongoing care.  Final Clinical Impressions(s)  / ED Diagnoses   Final diagnoses:  Shortness of breath  Bronchospasm    ED Discharge Orders    None       Garlon HatchetSanders, Rasheedah Reis M, PA-C 09/08/17 0202    Gilda CreasePollina, Christopher J, MD 09/08/17 [phone]

## 2017-09-08 NOTE — Progress Notes (Signed)
[facility] PCCM  Breathing has improved Boyfriend tells me she has been sick for two weeks, been in bed for about the last 48 hours.  Agree with CT angiogram Will add resp viral panel  Heber CarolinaBrent Gurkaran Rahm, MD [place] [facility] Pager: [phone] Cell: [phone] After 3pm or if no response, call [phone]

## 2017-09-08 NOTE — H&P (Signed)
History and Physical    Jillian MarionKathy Vila ZOX:[id number] DOB: 09-01-1961 DOA: 09/07/2017  PCP: No primary care provider on file.  Patient coming from: Home.  Chief Complaint: Shortness of breath.  HPI: Jillian Jefferson is a 56 y.o. female with history of hypertension presents with complaints of persistent cough and shortness of breath.  Patient states that the cough started 2 weeks ago and had gone to her PCP and was prescribed inhalers.  At the time patient blood pressure was found to be elevated and patient was started on amlodipine as per the patient.  Since then patient's cough has been progressively getting worse and with increasing wheezing and shortness of breath.  Denies any chest pain.  Patient had gone to her PCP again 3 days ago and was prescribed antibiotics.  Despite taking which patient shortness of breath and cough did not improve and patient came to the ER.  ED Course: In the ER patient is found to be wheezing bilaterally chest x-ray was unremarkable.  Patient was started on nebulizer treatment and steroids admitted for acute respiratory failure with hypoxia likely from bronchitis.  Review of Systems: As per HPI, rest all negative.   Past Medical History:  Diagnosis Date  . Hypertension     Past Surgical History:  Procedure Laterality Date  . MOUTH SURGERY       reports that  has never smoked. she has never used smokeless tobacco. She reports that she does not drink alcohol or use drugs.  No Known Allergies  Family History  Problem Relation Age of Onset  . Hypertension Other     Prior to Admission medications   Not on File    Physical Exam: Vitals:   09/08/17 0000 09/08/17 0013 09/08/17 0100 09/08/17 0115  BP: (!) 174/85  (!) 173/88   Pulse: (!) 122   (!) 137  Resp: (!) 22  (!) 24 (!) 22  Temp:      TempSrc:      SpO2: 100% 97%  96%  Weight:      Height:          Constitutional: Moderately built and nourished. Vitals:   09/08/17 0000 09/08/17 0013  09/08/17 0100 09/08/17 0115  BP: (!) 174/85  (!) 173/88   Pulse: (!) 122   (!) 137  Resp: (!) 22  (!) 24 (!) 22  Temp:      TempSrc:      SpO2: 100% 97%  96%  Weight:      Height:       Eyes: Anicteric no pallor. ENMT: No discharge from the ears eyes nose or mouth. Neck: No mass felt.  No neck rigidity. Respiratory: Bilateral expiratory wheeze heard no crepitations. Cardiovascular: S1-S2 heard no murmurs appreciated. Abdomen: Soft nontender bowel sounds present. Musculoskeletal: No edema.  No joint effusion. Skin: No rash.  Skin appears warm. Neurologic: Alert awake oriented to time place and person.  Moves all extremities. Psychiatric: Appears normal.  Normal affect.   Labs on Admission: I have personally reviewed following labs and imaging studies  CBC: Recent Labs  Lab 09/08/17 0001  WBC 14.0*  NEUTROABS 11.8*  HGB 12.1  HCT 37.9  MCV 91.1  PLT 360   Basic Metabolic Panel: Recent Labs  Lab 09/08/17 0001  NA 139  K 3.2*  CL 105  CO2 26  GLUCOSE 133*  BUN 11  CREATININE 0.65  CALCIUM 9.5   GFR: Estimated Creatinine Clearance: 85.4 mL/min (by C-G formula based on SCr of  0.65 mg/dL). Liver Function Tests: No results for input(s): AST, ALT, ALKPHOS, BILITOT, PROT, ALBUMIN in the last 168 hours. No results for input(s): LIPASE, AMYLASE in the last 168 hours. No results for input(s): AMMONIA in the last 168 hours. Coagulation Profile: No results for input(s): INR, PROTIME in the last 168 hours. Cardiac Enzymes: No results for input(s): CKTOTAL, CKMB, CKMBINDEX, TROPONINI in the last 168 hours. BNP (last 3 results) No results for input(s): PROBNP in the last 8760 hours. HbA1C: No results for input(s): HGBA1C in the last 72 hours. CBG: No results for input(s): GLUCAP in the last 168 hours. Lipid Profile: No results for input(s): CHOL, HDL, LDLCALC, TRIG, CHOLHDL, LDLDIRECT in the last 72 hours. Thyroid Function Tests: No results for input(s): TSH, T4TOTAL,  FREET4, T3FREE, THYROIDAB in the last 72 hours. Anemia Panel: No results for input(s): VITAMINB12, FOLATE, FERRITIN, TIBC, IRON, RETICCTPCT in the last 72 hours. Urine analysis: No results found for: COLORURINE, APPEARANCEUR, LABSPEC, PHURINE, GLUCOSEU, HGBUR, BILIRUBINUR, KETONESUR, PROTEINUR, UROBILINOGEN, NITRITE, LEUKOCYTESUR Sepsis Labs: @LABRCNTIP (procalcitonin:4,lacticidven:4) )No results found for this or any previous visit (from the past 240 hour(s)).   Radiological Exams on Admission: Dg Chest Port 1 View  Result Date: 09/08/2017 CLINICAL DATA:  56 y/o  F; shortness of breath, cough, wheezing. EXAM: PORTABLE CHEST 1 VIEW COMPARISON:  11/04/2016 chest radiograph FINDINGS: Stable heart size and mediastinal contours are within normal limits. Both lungs are clear. The visualized skeletal structures are unremarkable. IMPRESSION: No active disease. Electronically Signed   By: Mitzi HansenLance  Furusawa-Stratton M.D.   On: 09/08/2017 00:57    EKG: Independently reviewed.  Sinus tachycardia.  Assessment/Plan Principal Problem:   Acute bronchitis Active Problems:   Hypokalemia    1. Acute respiratory failure with hypoxia -suspect patient probably having bronchitis.  Check respiratory viral panel.  Patient is placed on IV steroids nebulizer treatment and Pulmicort.  I have also consulted pulmonary critical care since patient ABG shows PCO2 of 46 and patient is hypoxic.  His d-dimer is elevated will need to get a CT Carlyon ProwsAngie Graham of the chest. 2. Hypokalemia -replace and recheck.  Check magnesium levels.  We will hold hydrochlorothiazide until patient's potassium is corrected. 3. Hypertension uncontrolled -patient does not want to take amlodipine since he feels her symptoms worsen after taking that.  Patient is okay to continue with lisinopril.  Hydrochlorothiazide will be continued after potassium correction.  As needed IV hydralazine for now.   DVT prophylaxis: Lovenox. Code Status: Full  code. Family Communication: Discussed with patient. Disposition Plan: Home. Consults called: Pulmonary critical care. Admission status: Inpatient.   Eduard ClosArshad N Cianni Manny MD [facility] Pager [phone].  If 7PM-7AM, please contact night-coverage [email or web address] Password [email or web address]  09/08/2017, 1:45 AM

## 2017-09-09 ENCOUNTER — Inpatient Hospital Stay (HOSPITAL_COMMUNITY): Payer: No Typology Code available for payment source

## 2017-09-09 DIAGNOSIS — E876 Hypokalemia: Secondary | ICD-10-CM

## 2017-09-09 DIAGNOSIS — R0600 Dyspnea, unspecified: Secondary | ICD-10-CM

## 2017-09-09 DIAGNOSIS — J201 Acute bronchitis due to Hemophilus influenzae: Secondary | ICD-10-CM

## 2017-09-09 LAB — CBC
HCT: 32.6 % — ABNORMAL LOW (ref 36.0–46.0)
Hemoglobin: 10.3 g/dL — ABNORMAL LOW (ref 12.0–15.0)
MCH: 28.8 pg (ref 26.0–34.0)
MCHC: 31.6 g/dL (ref 30.0–36.0)
MCV: 91.1 fL (ref 78.0–100.0)
Platelets: 306 10*3/uL (ref 150–400)
RBC: 3.58 MIL/uL — ABNORMAL LOW (ref 3.87–5.11)
RDW: 14.1 % (ref 11.5–15.5)
WBC: 18.4 10*3/uL — ABNORMAL HIGH (ref 4.0–10.5)

## 2017-09-09 LAB — BASIC METABOLIC PANEL
Anion gap: 6 (ref 5–15)
BUN: 20 mg/dL (ref 6–20)
CO2: 26 mmol/L (ref 22–32)
Calcium: 9.2 mg/dL (ref 8.9–10.3)
Chloride: 106 mmol/L (ref 101–111)
Creatinine, Ser: 0.71 mg/dL (ref 0.44–1.00)
GFR calc Af Amer: >60 mL/min (ref >60)
GFR calc non Af Amer: >60 mL/min (ref >60)
Glucose, Bld: 182 mg/dL — ABNORMAL HIGH (ref 65–99)
Potassium: 4.4 mmol/L (ref 3.5–5.1)
Sodium: 138 mmol/L (ref 135–145)

## 2017-09-09 LAB — ECHOCARDIOGRAM COMPLETE
Height: 64 in
Weight: 3040 oz

## 2017-09-09 LAB — HIV ANTIBODY (ROUTINE TESTING W REFLEX): HIV SCREEN 4TH GENERATION: NONREACTIVE

## 2017-09-09 MED ORDER — ZOLPIDEM TARTRATE 5 MG PO TABS
5.0000 mg | ORAL_TABLET | Freq: Once | ORAL | Status: AC
  Administered 2017-09-09: 5 mg via ORAL
  Filled 2017-09-09: qty 1

## 2017-09-09 MED ORDER — PANTOPRAZOLE SODIUM 40 MG PO TBEC
40.0000 mg | DELAYED_RELEASE_TABLET | Freq: Every day | ORAL | Status: DC
  Administered 2017-09-09: 40 mg via ORAL
  Filled 2017-09-09: qty 1

## 2017-09-09 MED ORDER — HYDROCHLOROTHIAZIDE 12.5 MG PO CAPS: 12.5000 mg | ORAL_CAPSULE | Freq: Every day | ORAL | Status: DC

## 2017-09-09 MED ORDER — ALBUTEROL SULFATE (2.5 MG/3ML) 0.083% IN NEBU
2.5000 mg | INHALATION_SOLUTION | Freq: Three times a day (TID) | RESPIRATORY_TRACT | Status: DC
  Administered 2017-09-09 – 2017-09-10 (×3): 2.5 mg via RESPIRATORY_TRACT
  Filled 2017-09-09 (×5): qty 3

## 2017-09-09 MED ORDER — FUROSEMIDE 10 MG/ML IJ SOLN
40.0000 mg | Freq: Two times a day (BID) | INTRAMUSCULAR | Status: DC
  Administered 2017-09-09 – 2017-09-10 (×3): 40 mg via INTRAVENOUS
  Filled 2017-09-09 (×3): qty 4

## 2017-09-09 MED ORDER — PREDNISONE 20 MG PO TABS
30.0000 mg | ORAL_TABLET | Freq: Every day | ORAL | Status: DC
  Administered 2017-09-09 – 2017-09-10 (×2): 30 mg via ORAL
  Filled 2017-09-09 (×2): qty 1

## 2017-09-09 MED ORDER — LEVALBUTEROL HCL 0.63 MG/3ML IN NEBU: 0.6300 mg | INHALATION_SOLUTION | Freq: Three times a day (TID) | RESPIRATORY_TRACT | Status: DC

## 2017-09-09 MED ORDER — ALBUTEROL SULFATE (2.5 MG/3ML) 0.083% IN NEBU: 2.5000 mg | INHALATION_SOLUTION | RESPIRATORY_TRACT | Status: DC | PRN

## 2017-09-09 NOTE — Plan of Care (Signed)
  Nutrition: Adequate nutrition will be maintained 09/09/2017 1908 - Progressing by William Daltonarpenter, Helaina Stefano L, RN   Pain Managment: General experience of comfort will improve 09/09/2017 1908 - Progressing by William Daltonarpenter, Malisa Ruggiero L, RN   Activity: Risk for activity intolerance will decrease 09/09/2017 1908 - Progressing by William Daltonarpenter, Javon Hupfer L, RN

## 2017-09-09 NOTE — Care Management Note (Signed)
Case Management Note  Patient Details  Name: Jillian Jefferson MRN: [id number] Date of Birth: 12-21-1960  Subjective/Objective:                  resp failure  Action/Plan: Date: September 09, 2017 Marcelle SmilingRhonda Davis, BSN, WilliamsonRN3, [place] [phone] Chart and notes review for patient progress and needs. Will follow for case management and discharge needs. Next review date: [id number]  Expected Discharge Date:  (UNKNOWN)               Expected Discharge Plan:  Home/Self Care  In-House Referral:     Discharge planning Services  CM Consult  Post Acute Care Choice:    Choice offered to:     DME Arranged:    DME Agency:     HH Arranged:    HH Agency:     Status of Service:  In process, will continue to follow  If discussed at Long Length of Stay Meetings, dates discussed:    Additional Comments:  Golda AcreDavis, Rhonda Lynn, RN 09/09/2017, 8:29 AM

## 2017-09-09 NOTE — Progress Notes (Signed)
.. ..  Name: Jillian MarionKathy Jefferson MRN: [id number] DOB: Oct 07, 1960    ADMISSION DATE:  09/07/2017 CONSULTATION DATE:  09/08/17   REFERRING MD :  Toniann FailKakrakandy MD   CHIEF COMPLAINT:  Shortness of Breath   BRIEF PATIENT DESCRIPTION: 56 yr old female with PMHx of HTN presents with   SIGNIFICANT EVENTS   STUDIES:    HISTORY OF PRESENT ILLNESS:     56 yr old female never smoker with PMHx of HTN started Norvasc recently (a week ago) and within one day started to notice breathing difficulty. She saw a primary care doctor for this problem who continued her norvasc and added an inhaler which provided no relief so the patient stopped the norvasc. She does not use home oxygen at baseline and denies any history of breathing problems prior to this event. She states that three days ago the breathing got much worse. She admits to being immobile for several days due to the inclement weather. She has not had any recent surgeries and denies leg pain, fever and previous history of clotting/bleeding disorders and malignancy.  She does state that her shortness of breath is accompanied by productive cough ( yellowish sputum sometimes blood streaked) Chills but no documented fevers at home.   SUBJECTIVE:  Feels much better no distress VITAL SIGNS: Temp:  [98.4 F (36.9 C)-99 F (37.2 C)] 98.5 F (36.9 C) (12/12 0800) Pulse Rate:  [77-101] 77 (12/12 0700) Resp:  [14-33] 14 (12/12 0700) BP: (120-183)/(60-93) 138/75 (12/12 0700) SpO2:  [92 %-100 %] 100 % (12/12 0821)  PHYSICAL EXAMINATION: General: Pleasant 56 year old female currently sitting up in bed no acute distress. HEENT: Normocephalic atraumatic.  Mucous membranes are moist.  No jugular venous distention.  No upper airway wheezing. Pulmonary: Clear to auscultation no accessory use. Cardiac: Regular rate and rhythm without murmur rub or gallop. Abdomen: Soft nontender no organomegaly good bowel sounds. Extremities: Trace lower extremity edema Neuro:  Awake alert oriented no focal deficits Recent Labs  Lab 09/08/17 0001 09/08/17 0508 09/09/17 0319  NA 139 139 138  K 3.2* 3.2* 4.4  CL 105 105 106  CO2 26 25 26   BUN 11 10 20   CREATININE 0.65 0.66 0.71  GLUCOSE 133* 252* 182*   Recent Labs  Lab 09/08/17 0001 09/08/17 0508 09/09/17 0319  HGB 12.1 11.1* 10.3*  HCT 37.9 34.9* 32.6*  WBC 14.0* 15.8* 18.4*  PLT 360 345 306   Ct Angio Chest Pe W Or Wo Contrast  Result Date: 09/08/2017 CLINICAL DATA:  Shortness of breath with cough EXAM: CT ANGIOGRAPHY CHEST WITH CONTRAST TECHNIQUE: Multidetector CT imaging of the chest was performed using the standard protocol during bolus administration of intravenous contrast. Multiplanar CT image reconstructions and MIPs were obtained to evaluate the vascular anatomy. CONTRAST:  100 mL Isovue 370 nonionic COMPARISON:  Chest radiograph September 08, 2017 FINDINGS: Cardiovascular: There is no demonstrable pulmonary embolus. There is no appreciable thoracic aortic aneurysm or dissection. The visualized great vessels appear normal. There are scattered foci of calcification in the aorta. There are occasional foci of coronary artery calcification. There is no appreciable pericardial effusion or thickening. Mediastinum/Nodes: Visualized thyroid appears unremarkable. There is no appreciable thoracic adenopathy. No esophageal lesions are appreciable. Lungs/Pleura: There is no appreciable edema or consolidation. No pleural effusion or pleural thickening evident. Upper Abdomen: Visualized upper abdominal structures appear unremarkable. Musculoskeletal: There are foci of degenerative change in the thoracic spine. There are no blastic or lytic bone lesions. Review of the MIP images confirms the  above findings. IMPRESSION: 1.  No demonstrable pulmonary embolus. 2.  No lung edema or consolidation. 3.  No appreciable thoracic adenopathy. 4. Foci of atherosclerotic calcification noted in the aorta. There are occasional foci of  coronary artery calcification. Aortic Atherosclerosis (ICD10-I70.0). Electronically Signed   By: Bretta BangWilliam  Woodruff III M.D.   On: 09/08/2017 09:27   Dg Chest Port 1 View  Result Date: 09/08/2017 CLINICAL DATA:  56 y/o  F; shortness of breath, cough, wheezing. EXAM: PORTABLE CHEST 1 VIEW COMPARISON:  11/04/2016 chest radiograph FINDINGS: Stable heart size and mediastinal contours are within normal limits. Both lungs are clear. The visualized skeletal structures are unremarkable. IMPRESSION: No active disease. Electronically Signed   By: Mitzi HansenLance  Furusawa-Stratton M.D.   On: 09/08/2017 00:57    ASSESSMENT / PLAN:  Acute hypoxic respiratory failure in setting of acute purulent  bronchitis  -CT imaging negative for pulmonary emboli or PNA or other clear explanation for her hypoxia -RVP negative  -BNP negative -No history of asthma, smoked only as a teenager but not for long.  Does have a significant family history of reactive airway disease.  Also has significant history of gastroesophageal reflux disease. Plan/rec Complete 5-day course of antibiotics for purulent bronchitis Taper steroids to off over 5 days Home with rescue bronchodilator, can discontinue scheduled bronchodilator Would send her home w/ PPI Will make her post-hospital f/u w/ plans to get PFTs; she is set up for Jan 2 at 2 pm  H/o HTN Lower extremity Edema -was recently placed on CCB Plan F/u echo   We will s/o   Simonne MartinetPeter E Jemimah Cressy ACNP-BC [facility] Pager # [phone] OR # [phone] if no answer   09/09/2017, 9:38 AM

## 2017-09-09 NOTE — Progress Notes (Signed)
PROGRESS NOTE  Jillian MarionKathy Jefferson  ZOX:[id number] DOB: 1961/09/16 DOA: 09/07/2017 PCP: Thayer HeadingsMackenzie, Brian, MD  Brief Narrative:   The patient is a 56 year old female with history of hypertension and family history of asthma who presented with a 2-week history of cough, wheezing, and shortness of breath.  She had gone to her primary care doctor who prescribed her antibiotics but she did not improve so she presented to the emergency department.  Chest x-ray was unremarkable.  She was wheezing and started on steroids and nebulizer treatments.  D-dimer was elevated however her CT angiogram chest was negative for pulmonary embolism.  She is being treated for acute bronchitis/asthma at the direction of pulmonary/critical care.  She is feeling better.  Assessment & Plan:   Principal Problem:   Acute bronchitis Active Problems:   Hypokalemia   Acute respiratory failure with hypoxia (HCC)   Essential hypertension  Acute respiratory failure with hypoxia likely secondary to new diagnosis of asthma triggered by acute bronchitis.  She has a strong family history of asthma although she herself is never had wheezing.  Respiratory viral panel was negative.   -  Continue azithromycin at the direction of pulmonology -Continue prednisone taper - Follow-up with pulmonology as an outpatient to schedule pulmonary function tests -If cough and wheeze continue despite steroids and antibiotics, consider discontinuing her lisinopril -Ambulatory pulse ox/out of bed -Transfer to MedSurg  Hypokalemia, likely secondary to patient's hydrochlorothiazide, improved with potassium supplementation  Hypertension, uncontrolled.  Patient did not want to take amlodipine because she felt like her symptoms worsened.  She wants to continue lisinopril.  Continue IV hydralazine as needed.  Probable acute diastolic heart failure -Echocardiogram: Grade 1 diastolic dysfunction, suggestion of elevated left ventricular filling pressures.  IVC  was dilated and blunted suggestive of elevated CVP.  Also have some mildly elevated pulmonary arterial pressures consistent with mild pulmonary arterial hypertension. -Daily weights strict ins and outs -Start Lasix 40 mg IV twice daily  Leukocytosis likely secondary to steroids  DVT prophylaxis: Lovenox. Code Status: Full code. Family Communication: Discussed with patient. Disposition Plan: Home once able to perform basic ADLs without supplemental oxygen or significant dyspnea.    Consultants:   P CCM  Procedures:  CT angios chest  Antimicrobials:  Anti-infectives (From admission, onward)   Start     Dose/Rate Route Frequency Ordered Stop   09/08/17 0300  azithromycin (ZITHROMAX) 500 mg in dextrose 5 % 250 mL IVPB     500 mg 250 mL/hr over 60 Minutes Intravenous Every 24 hours 09/08/17 0246         Subjective: Feeling better today.  Her anxiety and cough have improved.  She does not Tessalon Perles overnight which she thinks have helped.  Her belly had been distended but she feels like it is less distended since she is presented to the hospital.  She has not had a bowel movement in a couple of days but despite that she still thinks that her belly is feeling better.  Objective: Vitals:   09/09/17 0800 09/09/17 0821 09/09/17 0900 09/09/17 1000  BP: 134/77  135/77 (!) 151/80  Pulse: 75  (!) 103 (!) 106  Resp: 19  16 (!) 24  Temp: 98.5 F (36.9 C)     TempSrc: Oral     SpO2: 93% 100% 91% 92%  Weight:      Height:        Intake/Output Summary (Last 24 hours) at 09/09/2017 1255 Last data filed at 09/09/2017 0000 Gross per  24 hour  Intake -  Output 800 ml  Net -800 ml   Filed Weights   09/07/17 2351  Weight: 86.2 kg (190 lb)    Examination:  General exam:  Adult female.  No acute distress.  HEENT:  NCAT, MMM Respiratory system: Diminished at the right base with some rales at the right base, wheezy cough Cardiovascular system: Regular rate and rhythm, normal  S1/S2. No murmurs, rubs, gallops or clicks.  Warm extremities Gastrointestinal system: Normal active bowel sounds, soft, nondistended, nontender. MSK:  Normal tone and bulk, no lower extremity edema Neuro:  Grossly intact    Data Reviewed: I have personally reviewed following labs and imaging studies  CBC: Recent Labs  Lab 09/08/17 0001 09/08/17 0508 09/09/17 0319  WBC 14.0* 15.8* 18.4*  NEUTROABS 11.8*  --   --   HGB 12.1 11.1* 10.3*  HCT 37.9 34.9* 32.6*  MCV 91.1 90.9 91.1  PLT 360 345 306   Basic Metabolic Panel: Recent Labs  Lab 09/08/17 0001 09/08/17 0508 09/09/17 0319  NA 139 139 138  K 3.2* 3.2* 4.4  CL 105 105 106  CO2 26 25 26   GLUCOSE 133* 252* 182*  BUN 11 10 20   CREATININE 0.65 0.66 0.71  CALCIUM 9.5 8.8* 9.2  MG  --  2.1  --    GFR: Estimated Creatinine Clearance: 85.4 mL/min (by C-G formula based on SCr of 0.71 mg/dL). Liver Function Tests: No results for input(s): AST, ALT, ALKPHOS, BILITOT, PROT, ALBUMIN in the last 168 hours. No results for input(s): LIPASE, AMYLASE in the last 168 hours. No results for input(s): AMMONIA in the last 168 hours. Coagulation Profile: No results for input(s): INR, PROTIME in the last 168 hours. Cardiac Enzymes: Recent Labs  Lab 09/08/17 0508  TROPONINI <0.03   BNP (last 3 results) No results for input(s): PROBNP in the last 8760 hours. HbA1C: No results for input(s): HGBA1C in the last 72 hours. CBG: No results for input(s): GLUCAP in the last 168 hours. Lipid Profile: No results for input(s): CHOL, HDL, LDLCALC, TRIG, CHOLHDL, LDLDIRECT in the last 72 hours. Thyroid Function Tests: No results for input(s): TSH, T4TOTAL, FREET4, T3FREE, THYROIDAB in the last 72 hours. Anemia Panel: No results for input(s): VITAMINB12, FOLATE, FERRITIN, TIBC, IRON, RETICCTPCT in the last 72 hours. Urine analysis: No results found for: COLORURINE, APPEARANCEUR, LABSPEC, PHURINE, GLUCOSEU, HGBUR, BILIRUBINUR, KETONESUR,  PROTEINUR, UROBILINOGEN, NITRITE, LEUKOCYTESUR Sepsis Labs: @LABRCNTIP (procalcitonin:4,lacticidven:4)  ) Recent Results (from the past 240 hour(s))  Respiratory Panel by PCR     Status: None   Collection Time: 09/08/17  2:46 AM  Result Value Ref Range Status   Adenovirus NOT DETECTED NOT DETECTED Final   Coronavirus 229E NOT DETECTED NOT DETECTED Final   Coronavirus HKU1 NOT DETECTED NOT DETECTED Final   Coronavirus NL63 NOT DETECTED NOT DETECTED Final   Coronavirus OC43 NOT DETECTED NOT DETECTED Final   Metapneumovirus NOT DETECTED NOT DETECTED Final   Rhinovirus / Enterovirus NOT DETECTED NOT DETECTED Final   Influenza A NOT DETECTED NOT DETECTED Final   Influenza B NOT DETECTED NOT DETECTED Final   Parainfluenza Virus 1 NOT DETECTED NOT DETECTED Final   Parainfluenza Virus 2 NOT DETECTED NOT DETECTED Final   Parainfluenza Virus 3 NOT DETECTED NOT DETECTED Final   Parainfluenza Virus 4 NOT DETECTED NOT DETECTED Final   Respiratory Syncytial Virus NOT DETECTED NOT DETECTED Final   Bordetella pertussis NOT DETECTED NOT DETECTED Final   Chlamydophila pneumoniae NOT DETECTED NOT DETECTED Final  Mycoplasma pneumoniae NOT DETECTED NOT DETECTED Final    Comment: Performed at [facility], [address] [place], [place] [address]  MRSA PCR Screening     Status: None   Collection Time: 09/08/17 10:11 AM  Result Value Ref Range Status   MRSA by PCR NEGATIVE NEGATIVE Final    Comment:        The GeneXpert MRSA Assay (FDA approved for NASAL specimens only), is one component of a comprehensive MRSA colonization surveillance program. It is not intended to diagnose MRSA infection nor to guide or monitor treatment for MRSA infections.       Radiology Studies: Ct Angio Chest Pe W Or Wo Contrast  Result Date: 09/08/2017 CLINICAL DATA:  Shortness of breath with cough EXAM: CT ANGIOGRAPHY CHEST WITH CONTRAST TECHNIQUE: Multidetector CT imaging of the chest was performed using  the standard protocol during bolus administration of intravenous contrast. Multiplanar CT image reconstructions and MIPs were obtained to evaluate the vascular anatomy. CONTRAST:  100 mL Isovue 370 nonionic COMPARISON:  Chest radiograph September 08, 2017 FINDINGS: Cardiovascular: There is no demonstrable pulmonary embolus. There is no appreciable thoracic aortic aneurysm or dissection. The visualized great vessels appear normal. There are scattered foci of calcification in the aorta. There are occasional foci of coronary artery calcification. There is no appreciable pericardial effusion or thickening. Mediastinum/Nodes: Visualized thyroid appears unremarkable. There is no appreciable thoracic adenopathy. No esophageal lesions are appreciable. Lungs/Pleura: There is no appreciable edema or consolidation. No pleural effusion or pleural thickening evident. Upper Abdomen: Visualized upper abdominal structures appear unremarkable. Musculoskeletal: There are foci of degenerative change in the thoracic spine. There are no blastic or lytic bone lesions. Review of the MIP images confirms the above findings. IMPRESSION: 1.  No demonstrable pulmonary embolus. 2.  No lung edema or consolidation. 3.  No appreciable thoracic adenopathy. 4. Foci of atherosclerotic calcification noted in the aorta. There are occasional foci of coronary artery calcification. Aortic Atherosclerosis (ICD10-I70.0). Electronically Signed   By: Bretta BangWilliam  Woodruff III M.D.   On: 09/08/2017 09:27   Dg Chest Port 1 View  Result Date: 09/08/2017 CLINICAL DATA:  56 y/o  F; shortness of breath, cough, wheezing. EXAM: PORTABLE CHEST 1 VIEW COMPARISON:  11/04/2016 chest radiograph FINDINGS: Stable heart size and mediastinal contours are within normal limits. Both lungs are clear. The visualized skeletal structures are unremarkable. IMPRESSION: No active disease. Electronically Signed   By: Mitzi HansenLance  Furusawa-Stratton M.D.   On: 09/08/2017 00:57      Scheduled Meds: . albuterol  2.5 mg Nebulization TID  . enoxaparin (LOVENOX) injection  40 mg Subcutaneous Q24H  . lisinopril  20 mg Oral Daily  . pantoprazole  40 mg Oral Q1200  . predniSONE  30 mg Oral Q breakfast   Continuous Infusions: . azithromycin Stopped (09/09/17 0328)     LOS: 1 day    Time spent: 30 min    Renae FickleMackenzie Rhonda Vangieson, MD [facility] Pager [phone]  If 7PM-7AM, please contact night-coverage [email or web address] Password [email or web address] 09/09/2017, 12:55 PM

## 2017-09-09 NOTE — Progress Notes (Signed)
*  P Echocardiogram 2D Echocardiogram has been performed.  [name], [name] M 09/09/2017, 10:42 AM

## 2017-09-10 LAB — BASIC METABOLIC PANEL
Anion gap: 9 (ref 5–15)
BUN: 23 mg/dL — ABNORMAL HIGH (ref 6–20)
CHLORIDE: 97 mmol/L — AB (ref 101–111)
CO2: 30 mmol/L (ref 22–32)
CREATININE: 0.81 mg/dL (ref 0.44–1.00)
Calcium: 8.9 mg/dL (ref 8.9–10.3)
GFR calc non Af Amer: >60 mL/min (ref >60)
Glucose, Bld: 110 mg/dL — ABNORMAL HIGH (ref 65–99)
Potassium: 3.6 mmol/L (ref 3.5–5.1)
SODIUM: 136 mmol/L (ref 135–145)

## 2017-09-10 LAB — CBC
HCT: 33.4 % — ABNORMAL LOW (ref 36.0–46.0)
HEMOGLOBIN: 10.6 g/dL — AB (ref 12.0–15.0)
MCH: 28.7 pg (ref 26.0–34.0)
MCHC: 31.7 g/dL (ref 30.0–36.0)
MCV: 90.5 fL (ref 78.0–100.0)
Platelets: 306 10*3/uL (ref 150–400)
RBC: 3.69 MIL/uL — AB (ref 3.87–5.11)
RDW: 14.2 % (ref 11.5–15.5)
WBC: 16.8 10*3/uL — ABNORMAL HIGH (ref 4.0–10.5)

## 2017-09-10 MED ORDER — AZITHROMYCIN 500 MG PO TABS: 500.0000 mg | ORAL_TABLET | Freq: Every day | ORAL | 0 refills | Status: AC

## 2017-09-10 MED ORDER — BENZONATATE 100 MG PO CAPS: 100.0000 mg | ORAL_CAPSULE | Freq: Three times a day (TID) | ORAL | 0 refills | Status: DC | PRN

## 2017-09-10 MED ORDER — ALBUTEROL SULFATE HFA 108 (90 BASE) MCG/ACT IN AERS: 2.0000 | INHALATION_SPRAY | Freq: Four times a day (QID) | RESPIRATORY_TRACT | 0 refills | Status: DC | PRN

## 2017-09-10 MED ORDER — FUROSEMIDE 40 MG PO TABS: 40.0000 mg | ORAL_TABLET | Freq: Every day | ORAL | 0 refills | Status: AC

## 2017-09-10 MED ORDER — PREDNISONE 10 MG PO TABS: 30.0000 mg | ORAL_TABLET | Freq: Every day | ORAL | 0 refills | Status: DC

## 2017-09-10 MED ORDER — LISINOPRIL 40 MG PO TABS: 40.0000 mg | ORAL_TABLET | Freq: Every day | ORAL | 0 refills | Status: DC

## 2017-09-10 MED ORDER — AZITHROMYCIN 250 MG PO TABS: 500.0000 mg | ORAL_TABLET | Freq: Every day | ORAL | Status: DC

## 2017-09-10 MED ORDER — GLYCOPYRROLATE-FORMOTEROL 9-4.8 MCG/ACT IN AERO: 2.0000 | INHALATION_SPRAY | Freq: Two times a day (BID) | RESPIRATORY_TRACT | 0 refills | Status: DC

## 2017-09-10 NOTE — Discharge Instructions (Signed)

## 2017-09-10 NOTE — Progress Notes (Signed)
PHARMACIST - PHYSICIAN COMMUNICATION CONCERNING: Antibiotic IV to Oral Route Change Policy  RECOMMENDATION: This patient is receiving azithromycin by the intravenous route.  Based on criteria approved by the Pharmacy and Therapeutics Committee, the antibiotic(s) is/are being converted to the equivalent oral dose form(s).   DESCRIPTION: These criteria include:  Patient being treated for a respiratory tract infection, urinary tract infection, cellulitis or clostridium difficile associated diarrhea if on metronidazole  The patient is not neutropenic and does not exhibit a GI malabsorption state  The patient is eating (either orally or via tube) and/or has been taking other orally administered medications for a least 24 hours  The patient is improving clinically and has a Tmax < 100.5  If you have questions about this conversion, please contact the Pharmacy Department  []   [phone]( [phone] )  [facility] []   [phone]( [phone] )  [facility] []   [phone]( [phone] )  [facility] []   [phone] )  [facility] [x]   [phone]( [phone] )  [facility]   Clance BollAmanda Liala Codispoti, PharmD, [place] Pager: [phone] 09/10/2017 12:43 PM

## 2017-09-10 NOTE — Discharge Summary (Signed)
Physician Discharge Summary  Jillian Jefferson ZOX:[id number] DOB: Mar 02, 1961 DOA: 09/07/2017  PCP: Thayer Headings, MD  Admit date: 09/07/2017 Discharge date: 09/10/2017  Admitted From: home  Disposition:  home  Recommendations for Outpatient Follow-up:  1. Follow up with PCP in 1 week for BMP to follow-up electrolytes and creatinine after starting daily Lasix 2. Recommend outpatient sleep study 3. Pulmonology follow up for PFTs after resolution of acute illness   Home Health:  none  Equipment/Devices:  Nebulizer   Discharge Condition:  Stable, improved CODE STATUS: Full code Diet recommendation: Regular  Brief/Interim Summary:   The patient is a 56 year old female with history of hypertension and family history of asthma who presented with a 2-week history of cough, wheezing, and shortness of breath.  She had gone to her primary care doctor who prescribed her antibiotics but she did not improve so she presented to the emergency department.  Chest x-ray was unremarkable.  She was wheezing and started on steroids and nebulizer treatments.  D-dimer was elevated however her CT angiogram chest was negative for pulmonary embolism.  She was treated for acute bronchitis/asthma at the direction of pulmonary/critical care.    Additionally, she complained of some swelling of her lower extremities and some abdominal fullness.  She was also given some diuretics due to concerns for acute diastolic heart failure.  Her echocardiogram demonstrated grade 1 diastolic dysfunction with a dilated IVC and decreased compressibility of the IVC suggestive of elevated CVP.  At the time of discharge, she was able to ambulate in the halls maintaining oxygen saturations 97% and higher without significant dyspnea.  Discharge Diagnoses:  Principal Problem:   Acute bronchitis Active Problems:   Hypokalemia   Acute respiratory failure with hypoxia (HCC)   Essential hypertension  Acute respiratory failure with hypoxia  likely secondary to new diagnosis of asthma, exacerbation triggered by acute bronchitis.  She has a strong family history of asthma although she herself is never had wheezing.  Respiratory viral panel was negative.   -  Continue azithromycin to complete course for pneumonia/bronchitis -  Prednisone burst -  Continue home glycopyrrolate-formoterol and prn albuterol  - Follow-up with pulmonology as an outpatient to schedule pulmonary function tests -  If cough and wheeze continue despite steroids and antibiotics, consider discontinuing her lisinopril  Nocturnal desaturations, intermittent down to the mid 80s usually but she had a few episodes where she would transiently go down to the high 70s with good waveform. -Recommend outpatient sleep study  Hypokalemia, likely secondary to patient's hydrochlorothiazide, improved with potassium supplementation  Hypertension, uncontrolled.  Patient did not want to take amlodipine because she felt like her symptoms worsened.  She wants to continue lisinopril.  Continue IV hydralazine as needed.  Hydrochlorothiazide changed to Lasix for better diuresis.  Anticipate that her blood pressure may start to trend down after she stops her steroids.  Will defer further management to her PCP.  Acute diastolic heart failure -Echocardiogram: Grade 1 diastolic dysfunction, suggestion of elevated left ventricular filling pressures.  IVC was dilated and blunted suggestive of elevated CVP.  Also have some mildly elevated pulmonary arterial pressures consistent with mild pulmonary arterial hypertension. -Weight is 89.1 kg on the date of discharge -Stopped her HCTZ and started her on Lasix 40 mg once daily  Leukocytosis likely secondary to steroids  Discharge Instructions  Discharge Instructions    (HEART FAILURE PATIENTS) Call MD:  Anytime you have any of the following symptoms: 1) 3 pound weight gain in 24 hours or  5 pounds in 1 week 2) shortness of breath, with or  without a dry hacking cough 3) swelling in the hands, feet or stomach 4) if you have to sleep on extra pillows at night in order to breathe.   Complete by:  As directed    Call MD for:  difficulty breathing, headache or visual disturbances   Complete by:  As directed    Call MD for:  extreme fatigue   Complete by:  As directed    Call MD for:  hives   Complete by:  As directed    Call MD for:  persistant dizziness or light-headedness   Complete by:  As directed    Call MD for:  persistant nausea and vomiting   Complete by:  As directed    Call MD for:  severe uncontrolled pain   Complete by:  As directed    Call MD for:  temperature >100.4   Complete by:  As directed    Diet - low sodium heart healthy   Complete by:  As directed    Increase activity slowly   Complete by:  As directed        Medication List    STOP taking these medications   lisinopril-hydrochlorothiazide 20-12.5 MG tablet Commonly known as:  PRINZIDE,ZESTORETIC     TAKE these medications   albuterol 108 (90 Base) MCG/ACT inhaler Commonly known as:  PROVENTIL HFA;VENTOLIN HFA Inhale 2 puffs into the lungs every 6 (six) hours as needed for wheezing or shortness of breath.   azithromycin 500 MG tablet Commonly known as:  ZITHROMAX Take 1 tablet (500 mg total) by mouth daily for 4 days. What changed:    medication strength  how much to take   benzonatate 100 MG capsule Commonly known as:  TESSALON Take 1 capsule (100 mg total) by mouth 3 (three) times daily as needed for cough.   BEVESPI AEROSPHERE 9-4.8 MCG/ACT Aero Generic drug:  Glycopyrrolate-Formoterol Inhale 2 puffs into the lungs 2 (two) times daily.   cetirizine 10 MG tablet Commonly known as:  ZYRTEC Take 10 mg by mouth daily.   cyclobenzaprine 10 MG tablet Commonly known as:  FLEXERIL Take 10 mg by mouth 3 (three) times daily as needed for muscle spasms.   furosemide 40 MG tablet Commonly known as:  LASIX Take 1 tablet (40 mg total)  by mouth daily.   ibuprofen 800 MG tablet Commonly known as:  ADVIL,MOTRIN Take 800 mg by mouth every 8 (eight) hours as needed for moderate pain.   lisinopril 40 MG tablet Commonly known as:  PRINIVIL,ZESTRIL Take 1 tablet (40 mg total) by mouth daily. Start taking on:  09/11/2017   multivitamin with minerals Tabs tablet Take 1 tablet by mouth daily.   OVER THE COUNTER MEDICATION Take 1 capsule by mouth 3 (three) times daily. Bio-X4   predniSONE 10 MG tablet Commonly known as:  DELTASONE Take 3 tablets (30 mg total) by mouth daily with breakfast. Start taking on:  09/11/2017      Follow-up Information    Bevelyn NgoGroce, Sarah F, NP Follow up on 09/30/2017.   Specialty:  Pulmonary Disease Why:  at 2 pm  Contact information: [address] [place] [address] [phone]        Thayer HeadingsMackenzie, Brian, MD. Schedule an appointment as soon as possible for a visit in 1 week(s).   Specialty:  Internal Medicine Contact information: [address], [address] [place] [address] [phone]  No Known Allergies  Consultations: Pulmonary critical care   Procedures/Studies: Ct Angio Chest Pe W Or Wo Contrast  Result Date: 09/08/2017 CLINICAL DATA:  Shortness of breath with cough EXAM: CT ANGIOGRAPHY CHEST WITH CONTRAST TECHNIQUE: Multidetector CT imaging of the chest was performed using the standard protocol during bolus administration of intravenous contrast. Multiplanar CT image reconstructions and MIPs were obtained to evaluate the vascular anatomy. CONTRAST:  100 mL Isovue 370 nonionic COMPARISON:  Chest radiograph September 08, 2017 FINDINGS: Cardiovascular: There is no demonstrable pulmonary embolus. There is no appreciable thoracic aortic aneurysm or dissection. The visualized great vessels appear normal. There are scattered foci of calcification in the aorta. There are occasional foci of coronary artery calcification. There is no appreciable  pericardial effusion or thickening. Mediastinum/Nodes: Visualized thyroid appears unremarkable. There is no appreciable thoracic adenopathy. No esophageal lesions are appreciable. Lungs/Pleura: There is no appreciable edema or consolidation. No pleural effusion or pleural thickening evident. Upper Abdomen: Visualized upper abdominal structures appear unremarkable. Musculoskeletal: There are foci of degenerative change in the thoracic spine. There are no blastic or lytic bone lesions. Review of the MIP images confirms the above findings. IMPRESSION: 1.  No demonstrable pulmonary embolus. 2.  No lung edema or consolidation. 3.  No appreciable thoracic adenopathy. 4. Foci of atherosclerotic calcification noted in the aorta. There are occasional foci of coronary artery calcification. Aortic Atherosclerosis (ICD10-I70.0). Electronically Signed   By: Bretta BangWilliam  Woodruff III M.D.   On: 09/08/2017 09:27   Dg Chest Port 1 View  Result Date: 09/08/2017 CLINICAL DATA:  56 y/o  F; shortness of breath, cough, wheezing. EXAM: PORTABLE CHEST 1 VIEW COMPARISON:  11/04/2016 chest radiograph FINDINGS: Stable heart size and mediastinal contours are within normal limits. Both lungs are clear. The visualized skeletal structures are unremarkable. IMPRESSION: No active disease. Electronically Signed   By: Mitzi HansenLance  Furusawa-Stratton M.D.   On: 09/08/2017 00:57   Echocardiogram:   - LVEF 65-70%, mild LVH, normal wall motion, grade 1 DD,   indeterminate LV filling pressure, mild LAE, trivial TR, RVSP 39   mmHg, dilated IVC suggestive of mild pulmonary hypertension.   Subjective: Coughing less, feeling better.  She is coughing up more phlegm that she was initially.  She feels less wheezy.  Discharge Exam: Vitals:   09/10/17 1000 09/10/17 1200  BP: (!) 172/109   Pulse: (!) 109   Resp: (!) 22   Temp:  98.6 F (37 C)  SpO2: 98%    Vitals:   09/10/17 0800 09/10/17 0900 09/10/17 1000 09/10/17 1200  BP: (!) 145/81 (!)  160/87 (!) 172/109   Pulse: 73 84 (!) 109   Resp: 15 19 (!) 22   Temp: 98.2 F (36.8 C)   98.6 F (37 C)  TempSrc: Oral   Oral  SpO2: 94% 97% 98%   Weight:      Height:        General: Pt is alert, awake, not in acute distress Cardiovascular: RRR, S1/S2 +, no rubs, no gallops Respiratory: Bilateral crackles, wheezes, and rhonchi  abdominal: Soft, NT, ND, bowel sounds + Extremities: no edema, no cyanosis    The results of significant diagnostics from this hospitalization (including imaging, microbiology, ancillary and laboratory) are listed below for reference.     Microbiology: Recent Results (from the past 240 hour(s))  Respiratory Panel by PCR     Status: None   Collection Time: 09/08/17  2:46 AM  Result Value Ref Range Status   Adenovirus NOT DETECTED NOT  DETECTED Final   Coronavirus 229E NOT DETECTED NOT DETECTED Final   Coronavirus HKU1 NOT DETECTED NOT DETECTED Final   Coronavirus NL63 NOT DETECTED NOT DETECTED Final   Coronavirus OC43 NOT DETECTED NOT DETECTED Final   Metapneumovirus NOT DETECTED NOT DETECTED Final   Rhinovirus / Enterovirus NOT DETECTED NOT DETECTED Final   Influenza A NOT DETECTED NOT DETECTED Final   Influenza B NOT DETECTED NOT DETECTED Final   Parainfluenza Virus 1 NOT DETECTED NOT DETECTED Final   Parainfluenza Virus 2 NOT DETECTED NOT DETECTED Final   Parainfluenza Virus 3 NOT DETECTED NOT DETECTED Final   Parainfluenza Virus 4 NOT DETECTED NOT DETECTED Final   Respiratory Syncytial Virus NOT DETECTED NOT DETECTED Final   Bordetella pertussis NOT DETECTED NOT DETECTED Final   Chlamydophila pneumoniae NOT DETECTED NOT DETECTED Final   Mycoplasma pneumoniae NOT DETECTED NOT DETECTED Final    Comment: Performed at [facility], [address] [place], [place] [address]  MRSA PCR Screening     Status: None   Collection Time: 09/08/17 10:11 AM  Result Value Ref Range Status   MRSA by PCR NEGATIVE NEGATIVE Final    Comment:        The  GeneXpert MRSA Assay (FDA approved for NASAL specimens only), is one component of a comprehensive MRSA colonization surveillance program. It is not intended to diagnose MRSA infection nor to guide or monitor treatment for MRSA infections.      Labs: BNP (last 3 results) Recent Labs    09/08/17 0508  BNP 60.0   Basic Metabolic Panel: Recent Labs  Lab 09/08/17 0001 09/08/17 0508 09/09/17 0319 09/10/17 0313  NA 139 139 138 136  K 3.2* 3.2* 4.4 3.6  CL 105 105 106 97*  CO2 26 25 26 30   GLUCOSE 133* 252* 182* 110*  BUN 11 10 20  23*  CREATININE 0.65 0.66 0.71 0.81  CALCIUM 9.5 8.8* 9.2 8.9  MG  --  2.1  --   --    Liver Function Tests: No results for input(s): AST, ALT, ALKPHOS, BILITOT, PROT, ALBUMIN in the last 168 hours. No results for input(s): LIPASE, AMYLASE in the last 168 hours. No results for input(s): AMMONIA in the last 168 hours. CBC: Recent Labs  Lab 09/08/17 0001 09/08/17 0508 09/09/17 0319 09/10/17 0313  WBC 14.0* 15.8* 18.4* 16.8*  NEUTROABS 11.8*  --   --   --   HGB 12.1 11.1* 10.3* 10.6*  HCT 37.9 34.9* 32.6* 33.4*  MCV 91.1 90.9 91.1 90.5  PLT 360 345 306 306   Cardiac Enzymes: Recent Labs  Lab 09/08/17 0508  TROPONINI <0.03   BNP: Invalid input(s): POCBNP CBG: No results for input(s): GLUCAP in the last 168 hours. D-Dimer Recent Labs    09/08/17 0001  DDIMER 2.76*   Hgb A1c No results for input(s): HGBA1C in the last 72 hours. Lipid Profile No results for input(s): CHOL, HDL, LDLCALC, TRIG, CHOLHDL, LDLDIRECT in the last 72 hours. Thyroid function studies No results for input(s): TSH, T4TOTAL, T3FREE, THYROIDAB in the last 72 hours.  Invalid input(s): FREET3 Anemia work up No results for input(s): VITAMINB12, FOLATE, FERRITIN, TIBC, IRON, RETICCTPCT in the last 72 hours. Urinalysis No results found for: COLORURINE, APPEARANCEUR, LABSPEC, PHURINE, GLUCOSEU, HGBUR, BILIRUBINUR, KETONESUR, PROTEINUR, UROBILINOGEN, NITRITE,  LEUKOCYTESUR Sepsis Labs Invalid input(s): PROCALCITONIN,  WBC,  LACTICIDVEN   Time coordinating discharge: Over 30 minutes  SIGNED:   Renae Fickle, MD  [facility] 09/10/2017, 12:49 PM Pager  If 7PM-7AM, please contact night-coverage [email or web address] Password [email or web address]

## 2017-09-10 NOTE — Progress Notes (Signed)
Date: September 10, 2017 Chart review for discharge needs:  None found for case management. Patient has no questions concerning post hospital care. 

## 2017-09-30 ENCOUNTER — Ambulatory Visit: Payer: BLUE CROSS/BLUE SHIELD | Admitting: Acute Care

## 2017-09-30 ENCOUNTER — Encounter: Payer: Self-pay | Admitting: Acute Care

## 2017-09-30 ENCOUNTER — Other Ambulatory Visit: Payer: BLUE CROSS/BLUE SHIELD

## 2017-09-30 VITALS — BP 152/80 | HR 100 | Ht 64.0 in | Wt 194.4 lb

## 2017-09-30 DIAGNOSIS — I272 Pulmonary hypertension, unspecified: Secondary | ICD-10-CM

## 2017-09-30 DIAGNOSIS — R059 Cough, unspecified: Secondary | ICD-10-CM

## 2017-09-30 DIAGNOSIS — R05 Cough: Secondary | ICD-10-CM

## 2017-09-30 DIAGNOSIS — J209 Acute bronchitis, unspecified: Secondary | ICD-10-CM | POA: Diagnosis not present

## 2017-09-30 LAB — NITRIC OXIDE: Nitric Oxide: 80

## 2017-09-30 MED ORDER — FLUTICASONE FUROATE-VILANTEROL 100-25 MCG/INH IN AEPB: 1.0000 | INHALATION_SPRAY | Freq: Every day | RESPIRATORY_TRACT | 0 refills | Status: DC

## 2017-09-30 MED ORDER — OMEPRAZOLE 40 MG PO CPDR: 40.0000 mg | DELAYED_RELEASE_CAPSULE | Freq: Every day | ORAL | 2 refills | Status: DC

## 2017-09-30 MED ORDER — PREDNISONE 10 MG PO TABS: ORAL_TABLET | ORAL | 0 refills | Status: DC

## 2017-09-30 MED ORDER — BENZONATATE 100 MG PO CAPS: 100.0000 mg | ORAL_CAPSULE | Freq: Three times a day (TID) | ORAL | 0 refills | Status: DC | PRN

## 2017-09-30 NOTE — Progress Notes (Addendum)
History of Present Illness Jillian MarionKathy Fair is a 57 y.o. female with history of HTN and family history of asthma. [facility] and Anders SimmondsPete Babcock   10/01/2017 Hospital Follow Up: Admission Date: 09/07/2017 Discharge Date:  09/10/2017 Pt was started on Norvasc recently (a week ago) and within one day started to notice breathing difficulty. She saw a primary care doctor for this problem who continued her norvasc and added an inhaler which provided no relief so the patient stopped the norvasc. She does not use home oxygen at baseline and denies any history of breathing problems prior to this event. She states that on 12/10  the breathing got much worse, and she presented to the ED.Marland Kitchen. She endorsed  being immobile for several days due to the inclement weather. She has not had any recent surgeries and denies leg pain, fever and previous history of clotting/bleeding disorders and malignancy.  She did  state that her shortness of breath was  accompanied by productive cough ( yellowish sputum sometimes blood streaked) Chills but no documented fevers at home.   CTA was negative for PE or pneumonia RVP negative  -BNP negative -No history of asthma, smoked only as a teenager but not for long.  Does have a significant family history of reactive airway disease.  Also has significant history of gastroesophageal reflux disease.  Plan/rec as an inpatient  Complete 5-day course of antibiotics for purulent bronchitis Taper steroids to off over 5 days Home with rescue bronchodilator, can discontinue scheduled bronchodilator Would send her home w/ PPI Will make her post-hospital f/u w/ plans to get PFTs; she is set up for Jan 2 at 2 pm  She presents today stating she is feeling better. She states her secretions are clear but she has a cough and continued sputum production. She states she has been using Tessalon perles for the cough and she is using Mucinex.She completed her antibiotics in  the hospital. She had a 3 day prednisone taper that she has completed.She states her cough got better with prednisone and returned when the dosing was completed. She states she has some wheezing when lying flat only. She continues to have a non-productive cough.She denies fever, chest pain, orthopnea or hemoptysis.No leg or calf pain.   Test Results: LVEF 65-70%, mild LVH, normal wall motion, grade 1 DD,   indeterminate LV filling pressure, mild LAE, trivial TR, RVSP 39   mmHg, dilated IVC suggestive of mild pulmonary hypertension.   CBC Latest Ref Rng & Units 09/10/2017 09/09/2017 09/08/2017  WBC 4.0 - 10.5 K/uL 16.8(H) 18.4(H) 15.8(H)  Hemoglobin 12.0 - 15.0 g/dL 10.6(L) 10.3(L) 11.1(L)  Hematocrit 36.0 - 46.0 % 33.4(L) 32.6(L) 34.9(L)  Platelets 150 - 400 K/uL 306 306 345    BMP Latest Ref Rng & Units 09/10/2017 09/09/2017 09/08/2017  Glucose 65 - 99 mg/dL 409(W110(H) 119(J182(H) 478(G252(H)  BUN 6 - 20 mg/dL 95(A23(H) 20 10  Creatinine 0.44 - 1.00 mg/dL 2.130.81 0.860.71 5.780.66  Sodium 135 - 145 mmol/L 136 138 139  Potassium 3.5 - 5.1 mmol/L 3.6 4.4 3.2(L)  Chloride 101 - 111 mmol/L 97(L) 106 105  CO2 22 - 32 mmol/L 30 26 25   Calcium 8.9 - 10.3 mg/dL 8.9 9.2 4.6(N8.8(L)    BNP    Component Value Date/Time   BNP 60.0 09/08/2017 0508     Ct Angio Chest Pe W Or Wo Contrast  Result Date: 09/08/2017 CLINICAL DATA:  Shortness of breath with cough EXAM: CT ANGIOGRAPHY  CHEST WITH CONTRAST TECHNIQUE: Multidetector CT imaging of the chest was performed using the standard protocol during bolus administration of intravenous contrast. Multiplanar CT image reconstructions and MIPs were obtained to evaluate the vascular anatomy. CONTRAST:  100 mL Isovue 370 nonionic COMPARISON:  Chest radiograph September 08, 2017 FINDINGS: Cardiovascular: There is no demonstrable pulmonary embolus. There is no appreciable thoracic aortic aneurysm or dissection. The visualized great vessels appear normal. There are scattered foci of  calcification in the aorta. There are occasional foci of coronary artery calcification. There is no appreciable pericardial effusion or thickening. Mediastinum/Nodes: Visualized thyroid appears unremarkable. There is no appreciable thoracic adenopathy. No esophageal lesions are appreciable. Lungs/Pleura: There is no appreciable edema or consolidation. No pleural effusion or pleural thickening evident. Upper Abdomen: Visualized upper abdominal structures appear unremarkable. Musculoskeletal: There are foci of degenerative change in the thoracic spine. There are no blastic or lytic bone lesions. Review of the MIP images confirms the above findings. IMPRESSION: 1.  No demonstrable pulmonary embolus. 2.  No lung edema or consolidation. 3.  No appreciable thoracic adenopathy. 4. Foci of atherosclerotic calcification noted in the aorta. There are occasional foci of coronary artery calcification. Aortic Atherosclerosis (ICD10-I70.0). Electronically Signed   By: Bretta Bang III M.D.   On: 09/08/2017 09:27   Dg Chest Port 1 View  Result Date: 09/08/2017 CLINICAL DATA:  57 y/o  F; shortness of breath, cough, wheezing. EXAM: PORTABLE CHEST 1 VIEW COMPARISON:  11/04/2016 chest radiograph FINDINGS: Stable heart size and mediastinal contours are within normal limits. Both lungs are clear. The visualized skeletal structures are unremarkable. IMPRESSION: No active disease. Electronically Signed   By: Mitzi Hansen M.D.   On: 09/08/2017 00:57     Past medical hx Past Medical History:  Diagnosis Date  . Hypertension      Social History   Tobacco Use  . Smoking status: Never Smoker  . Smokeless tobacco: Never Used  Substance Use Topics  . Alcohol use: No    Frequency: Never  . Drug use: No    Ms.Lincoln reports that  has never smoked. she has never used smokeless tobacco. She reports that she does not drink alcohol or use drugs.  Tobacco Cessation: Never Smoker  Past surgical hx, Family  hx, Social hx all reviewed.  Current Outpatient Medications on File Prior to Visit  Medication Sig  . albuterol (PROVENTIL HFA;VENTOLIN HFA) 108 (90 Base) MCG/ACT inhaler Inhale 2 puffs into the lungs every 6 (six) hours as needed for wheezing or shortness of breath.  . cetirizine (ZYRTEC) 10 MG tablet Take 10 mg by mouth daily.  . cyclobenzaprine (FLEXERIL) 10 MG tablet Take 10 mg by mouth 3 (three) times daily as needed for muscle spasms.  . fluticasone furoate-vilanterol (BREO ELLIPTA) 100-25 MCG/INH AEPB Inhale 1 puff into the lungs daily.  . furosemide (LASIX) 40 MG tablet Take 1 tablet (40 mg total) by mouth daily.  Marland Kitchen ibuprofen (ADVIL,MOTRIN) 800 MG tablet Take 800 mg by mouth every 8 (eight) hours as needed for moderate pain.  Marland Kitchen lisinopril (PRINIVIL,ZESTRIL) 40 MG tablet Take 1 tablet (40 mg total) by mouth daily.  . Multiple Vitamin (MULTIVITAMIN WITH MINERALS) TABS tablet Take 1 tablet by mouth daily.  Marland Kitchen OVER THE COUNTER MEDICATION Take 1 capsule by mouth 3 (three) times daily. Bio-X4  . predniSONE (DELTASONE) 10 MG tablet Take 3 tablets (30 mg total) by mouth daily with breakfast.   No current facility-administered medications on file prior to visit.  No Known Allergies  Review Of Systems:  Constitutional:   No  weight loss, night sweats,  Fevers, chills, fatigue, or  lassitude.  HEENT:   No headaches,  Difficulty swallowing,  Tooth/dental problems, or  Sore throat,                No sneezing, itching, ear ache, nasal congestion, + post nasal drip,   CV:  No chest pain,  Orthopnea, PND, swelling in lower extremities, anasarca, dizziness, palpitations, syncope.   GI  No heartburn, indigestion, abdominal pain, nausea, vomiting, diarrhea, change in bowel habits, loss of appetite, bloody stools.   Resp: No shortness of breath with exertion or at rest.  + excess clear mucus, + productive cough,  +  non-productive cough,  No coughing up of blood.  No change in color of mucus.   +  Wheezing when laying flat.  No chest wall deformity  Skin: no rash or lesions.  GU: no dysuria, change in color of urine, no urgency or frequency.  No flank pain, no hematuria   MS:  No joint pain or swelling.  No decreased range of motion.  No back pain.  Psych:  No change in mood or affect. No depression or anxiety.  No memory loss.   Vital Signs BP (!) 152/80   Pulse 100   Ht 5\' 4"  (1.626 m)   Wt 194 lb 6.4 oz (88.2 kg)   SpO2 97%   BMI 33.37 kg/m    Physical Exam:  General- No distress,  A&Ox3, pleasant ENT: No sinus tenderness, TM clear, pale nasal mucosa, no oral exudate,no post nasal drip, no LAN Cardiac: S1, S2, regular rate and rhythm, no murmur Chest: No wheeze/ rales/ dullness; no accessory muscle use, no nasal flaring, no sternal retractions Abd.: Soft Non-tender, non-distended Ext: No clubbing cyanosis, edema Neuro:  normal strength Skin: No rashes, warm and dry Psych: normal mood and behavior   Assessment/Plan  Acute bronchitis Acute bronchitis/ Suspected asthmatic Started Norvasc 3 days prior to acute respiratory failure requiring hospitalization ( Subsequently stopped)  Suspect component of GERD and Upper Airway cough syndrome FENO 09/30/2017>> 80ppb Plan: FENO Prednisone taper; 10 mg tablets: 4 tabs x 2 days, 3 tabs x 2 days, 2 tabs x 2 days 1 tab x 2 days then stop. Please schecdule for PFT's  ACE level today Schedule for sleep study Omeprazole 40 mg once daily Sips of water instead of throat clearing Sugar free hard handy for throat soothing ( Jolly Ranchers) GERD Diet Avoid peppermint, Spearmint, Menthol,Chocolate Delsym 5 cc's every 12 hours Continue Breo 1 puff once daily Rinse mouth after use Tessalon Perles 100 mg three times daily as needed for cough  Do not drive if sleepy. Follow up in  2 weeks with Dr. Sherene Sires or Maralyn Sago NP.     Bevelyn Ngo, NP 10/01/2017  9:51 PM

## 2017-09-30 NOTE — Patient Instructions (Addendum)
It is nice to meet you today.  FENO Prednisone taper; 10 mg tablets: 4 tabs x 2 days, 3 tabs x 2 days, 2 tabs x 2 days 1 tab x 2 days then stop. Please schecdule for PFT's  ACE level today Schedule for sleep study Omeprazole 40 mg once daily Sips of water instead of throat clearing Sugar free hard handy for throat soothing ( Jolly Ranchers) GERD Diet Avoid peppermint, Spearmint, Menthol,Chocolate Delsym 5 cc's every 12 hours Continue Breo 1 puff once daily Rinse mouth after use Tessalon Perles 100 mg three times daily as needed for cough  Do not drive if sleepy. Follow up in  2 weeks with Dr. Sherene SiresWert or Maralyn SagoSarah NP.

## 2017-10-01 ENCOUNTER — Encounter: Payer: Self-pay | Admitting: Acute Care

## 2017-10-01 LAB — ANGIOTENSIN CONVERTING ENZYME: Angiotensin-Converting Enzyme: <5 U/L — ABNORMAL LOW (ref 9–67)

## 2017-10-01 NOTE — Assessment & Plan Note (Addendum)
Acute bronchitis/ Suspected asthmatic Started Norvasc 3 days prior to acute respiratory failure requiring hospitalization ( Subsequently stopped)  Suspect component of GERD and Upper Airway cough syndrome FENO 09/30/2017>> 80ppb Plan: FENO Prednisone taper; 10 mg tablets: 4 tabs x 2 days, 3 tabs x 2 days, 2 tabs x 2 days 1 tab x 2 days then stop. Please schecdule for PFT's  ACE level today Schedule for sleep study Omeprazole 40 mg once daily Sips of water instead of throat clearing Sugar free hard handy for throat soothing ( Jolly Ranchers) GERD Diet Avoid peppermint, Spearmint, Menthol,Chocolate Delsym 5 cc's every 12 hours Continue Breo 1 puff once daily Rinse mouth after use Tessalon Perles 100 mg three times daily as needed for cough  Do not drive if sleepy. Follow up in  2 weeks with Dr. Sherene SiresWert or Maralyn SagoSarah NP.

## 2017-10-02 NOTE — Progress Notes (Signed)
Chart and office note reviewed in detail  > agree with a/p as outlined    

## 2017-10-14 DIAGNOSIS — G4733 Obstructive sleep apnea (adult) (pediatric): Secondary | ICD-10-CM | POA: Diagnosis not present

## 2017-10-15 DIAGNOSIS — G4733 Obstructive sleep apnea (adult) (pediatric): Secondary | ICD-10-CM | POA: Diagnosis not present

## 2017-10-16 ENCOUNTER — Telehealth: Payer: Self-pay | Admitting: Internal Medicine

## 2017-10-16 ENCOUNTER — Ambulatory Visit: Payer: BLUE CROSS/BLUE SHIELD | Admitting: Internal Medicine

## 2017-10-16 ENCOUNTER — Encounter: Payer: Self-pay | Admitting: Internal Medicine

## 2017-10-16 ENCOUNTER — Ambulatory Visit (INDEPENDENT_AMBULATORY_CARE_PROVIDER_SITE_OTHER): Payer: BLUE CROSS/BLUE SHIELD | Admitting: Internal Medicine

## 2017-10-16 VITALS — BP 136/90 | HR 88 | Ht 65.0 in | Wt 193.2 lb

## 2017-10-16 DIAGNOSIS — R059 Cough, unspecified: Secondary | ICD-10-CM

## 2017-10-16 DIAGNOSIS — R05 Cough: Secondary | ICD-10-CM | POA: Diagnosis not present

## 2017-10-16 DIAGNOSIS — J45991 Cough variant asthma: Secondary | ICD-10-CM | POA: Diagnosis not present

## 2017-10-16 DIAGNOSIS — I272 Pulmonary hypertension, unspecified: Secondary | ICD-10-CM

## 2017-10-16 DIAGNOSIS — I10 Essential (primary) hypertension: Secondary | ICD-10-CM | POA: Diagnosis not present

## 2017-10-16 LAB — PULMONARY FUNCTION TEST
DL/VA % PRED: 107 %
DL/VA: 5.28 ml/min/mmHg/L
DLCO COR % PRED: 69 %
DLCO cor: 17.69 ml/min/mmHg
DLCO unc % pred: 73 %
DLCO unc: 18.73 ml/min/mmHg
FEF 25-75 Post: 3.04 L/sec
FEF 25-75 Pre: 2.34 L/sec
FEF2575-%CHANGE-POST: 30 %
FEF2575-%PRED-POST: 130 %
FEF2575-%Pred-Pre: 100 %
FEV1-%CHANGE-POST: 5 %
FEV1-%Pred-Post: 87 %
FEV1-%Pred-Pre: 83 %
FEV1-PRE: 1.92 L
FEV1-Post: 2.02 L
FEV1FVC-%CHANGE-POST: 3 %
FEV1FVC-%PRED-PRE: 106 %
FEV6-%Change-Post: 2 %
FEV6-%Pred-Post: 81 %
FEV6-%Pred-Pre: 79 %
FEV6-POST: 2.29 L
FEV6-Pre: 2.25 L
FEV6FVC-%Pred-Post: 103 %
FEV6FVC-%Pred-Pre: 103 %
FVC-%CHANGE-POST: 2 %
FVC-%PRED-PRE: 77 %
FVC-%Pred-Post: 79 %
FVC-POST: 2.29 L
FVC-PRE: 2.25 L
POST FEV1/FVC RATIO: 88 %
Post FEV6/FVC ratio: 100 %
Pre FEV1/FVC ratio: 85 %
Pre FEV6/FVC Ratio: 100 %
RV % PRED: 67 %
RV: 1.32 L
TLC % pred: 70 %
TLC: 3.66 L

## 2017-10-16 MED ORDER — FAMOTIDINE 20 MG PO TABS: ORAL_TABLET | ORAL | Status: DC

## 2017-10-16 MED ORDER — BUDESONIDE-FORMOTEROL FUMARATE 80-4.5 MCG/ACT IN AERO: 2.0000 | INHALATION_SPRAY | Freq: Two times a day (BID) | RESPIRATORY_TRACT | 12 refills | Status: DC

## 2017-10-16 NOTE — Progress Notes (Signed)
PFT done today. 

## 2017-10-16 NOTE — Assessment & Plan Note (Signed)
Off ACEi ? 09/2017  - PFT's 10/16/2017  wnl on BREO but still coughing  - 10/16/2017  After extensive coaching inhaler device  effectiveness =    90% > changed breo to symbicort 80 2bid due to cough    DDX of  difficult airways management almost all start with A and  include Adherence, Ace Inhibitors, Acid Reflux, Active Sinus Disease, Alpha 1 Antitripsin deficiency, Anxiety masquerading as Airways dz,  ABPA,  Allergy(esp in young), Aspiration (esp in elderly), Adverse effects of meds,  Active smokers, A bunch of PE's (a small clot burden can't cause this syndrome unless there is already severe underlying pulm or vascular dz with poor reserve) plus two Bs  = Bronchiectasis and Beta blocker use..and one C= CHF  Adherence is always the initial "prime suspect" and is a multilayered concern that requires a "trust but verify" approach in every patient - starting with knowing how to use medications, especially inhalers, correctly, keeping up with refills and understanding the fundamental difference between maintenance and prns vs those medications only taken for a very short course and then stopped and not refilled.  - see hfa teaching - return with all meds in hand using a trust but verify approach to confirm accurate Medication  Reconciliation The principal here is that until we are certain that the  patients are doing what we've asked, it makes no sense to ask them to do more.   ACEi adverse effects at the  top of the usual list of suspects and the only way to rule it out is a trial off > see a/p    ? Acid (or non-acid) GERD > always difficult to exclude as up to 75% of pts in some series report no assoc GI/ Heartburn symptoms> rec max (24h)  acid suppression and diet restrictions/ reviewed and instructions given in writing.   ? Adverse effects of DPI > try off  BREO and just use low dose symb  ? Allergy > not FENO prev 80 >>  consider w/u next ov if not controlled on low dose symb   ? chf > echo with  diastolic dysfunction    .I had an extended discussion with the patient reviewing all relevant studies completed to date and  lasting 25 minutes of a 40  minute transition of care office  visit     re  severe non-specific but potentially very serious refractory respiratory symptoms of uncertain and potentially multiple  etiologies.   Each maintenance medication was reviewed in detail including most importantly the difference between maintenance and prns and under what circumstances the prns are to be triggered using an action plan format that is not reflected in the computer generated alphabetically organized AVS.    Please see AVS for specific instructions unique to this office visit that I personally wrote and verbalized to the the pt in detail and then reviewed with pt  by my nurse highlighting any changes in therapy/plan of care  recommended at today's visit.

## 2017-10-16 NOTE — Telephone Encounter (Signed)
Spoke with pt she states she turned in her sleep equipment yesterday and wanted to know her results. I advised that it would take a little longer to receive the results and we would call her when it gets resulted. Pt understood and nothing further is needed.

## 2017-10-16 NOTE — Patient Instructions (Addendum)
Plan A = Automatic = symbicort 80 Take 2 puffs first thing in am and then another 2 puffs about 12 hours later.  Prilosec 40 mg Take 30-60 min before first meal of the day and add pepcid 20 mg at bedtime until no cough at all for a week then stop it   Work on inhaler technique:  relax and gently blow all the way out then take a nice smooth deep breath back in, triggering the inhaler at same time you start breathing in.  Hold for up to 5 seconds if you can. Blow out thru nose. Rinse and gargle with water when done       Plan B = Backup Only use your albuterol (ventolin)as a rescue medication to be used if you can't catch your breath by resting or doing a relaxed purse lip breathing pattern.  - The less you use it, the better it will work when you need it. - Ok to use the inhaler up to 2 puffs  every 4 hours if you must but call for appointment if use goes up over your usual need - Don't leave home without it !!  (think of it like the spare tire for your car)    GERD (REFLUX)  is an extremely common cause of respiratory symptoms just like yours , many times with no obvious heartburn at all.    It can be treated with medication, but also with lifestyle changes including elevation of the head of your bed (ideally with 6 inch  bed blocks),  Smoking cessation, avoidance of late meals, excessive alcohol, and avoid fatty foods, chocolate, peppermint, colas, red wine, and acidic juices such as orange juice.  NO MINT OR MENTHOL PRODUCTS SO NO COUGH DROPS  USE SUGARLESS CANDY INSTEAD (Jolley ranchers or Stover's or Life Savers) or even ice chips will also do - the key is to swallow to prevent all throat clearing. NO OIL BASED VITAMINS - use powdered substitutes.      Please schedule a follow up office visit in 4 weeks, sooner if needed  with all medications /inhalers/ solutions in hand so we can verify exactly what you are taking. This includes all medications from all doctors and over the counters

## 2017-10-16 NOTE — Assessment & Plan Note (Addendum)
Echo - LVEF 65-70%, mild LVH, normal wall motion, grade 1 DD,   indeterminate LV filling pressure, mild LAE, trivial TR, RVSP 39   mmHg, dilated IVC suggestive of mild pulmonary hypertension.   Adequate control on present rx, reviewed in detail with pt > no change in rx needed  > f/u Gangi   NB Although even in retrospect it may not be clear the ACEi contributed to the pt's symptoms,  Pt improved off them and adding them back at this point or in the future would risk confusion in interpretation of non-specific respiratory symptoms to which this patient is prone  ie  Better not to muddy the waters here.

## 2017-10-16 NOTE — Progress Notes (Signed)
History of Present Illness Jillian Jefferson is a 57 y.o. female with history of HTN and family history of asthma. She was seen in the hospital by [facility] and Anders Simmonds   10/01/2017 Hospital Follow Up: Admission Date: 09/07/2017 Discharge Date:  09/10/2017 Pt was started on Norvasc recently (a week ago) and within one day started to notice breathing difficulty. She saw a primary care doctor for this problem who continued her norvasc and added an inhaler which provided no relief so the patient stopped the norvasc. She does not use home oxygen at baseline and denies any history of breathing problems prior to this event. She states that on 12/10  the breathing got much worse, and she presented to the ED.Marland Kitchen She endorsed  being immobile for several days due to the inclement weather. She has not had any recent surgeries and denies leg pain, fever and previous history of clotting/bleeding disorders and malignancy.  She did  state that her shortness of breath was  accompanied by productive cough ( yellowish sputum sometimes blood streaked) Chills but no documented fevers at home.   CTA was negative for PE or pneumonia RVP negative  -BNP negative -No history of asthma, smoked only as a teenager but not for long.  Does have a significant family history of reactive airway disease.  Also has significant history of gastroesophageal reflux disease.  Plan/rec as an inpatient  Complete 5-day course of antibiotics for purulent bronchitis Taper steroids to off over 5 days Home with rescue bronchodilator, can discontinue scheduled bronchodilator Would send her home w/ PPI Will make her post-hospital f/u w/ plans to get PFTs; she is set up for Jan 2 at 2 pm  She presents today stating she is feeling better. She states her secretions are clear but she has a cough and continued sputum production. She states she has been using Tessalon perles for the cough and she is using Mucinex.She completed her antibiotics in  the hospital. She had a 3 day prednisone taper that she has completed.She states her cough got better with prednisone and returned when the dosing was completed. She states she has some wheezing when lying flat only. She continues to have a non-productive cough.She denies fever, chest pain, orthopnea or hemoptysis.No leg or calf pain. rec FENO = 80 Prednisone taper; 10 mg tablets: 4 tabs x 2 days, 3 tabs x 2 days, 2 tabs x 2 days 1 tab x 2 days then stop. Please schecdule for PFT's  ACE level today Schedule for sleep study Omeprazole 40 mg once daily Sips of water instead of throat clearing Sugar free hard handy for throat soothing ( Jolly Ranchers) GERD Diet Avoid peppermint, Spearmint, Menthol,Chocolate Delsym 5 cc's every 12 hours Continue Breo 1 puff once daily Rinse mouth after use Tessalon Perles 100 mg three times daily as needed for cough  Do not drive if sleepy.    10/16/2017  f/u ov/Jillian Jefferson re: cough > sob  Chief Complaint  Patient presents with  . Follow-up    PFT done today. Pt states she was better while on prednisone but once she finished, cough came back. States currently her cough is mild and nonproductive and has had some episodes of SOB.   57 yobf minimal smoker as teenager, good at sports with new onset winter time cough around 2016 and recurred while on lisinopril with noct Wheezing" chronically then much worse  around early Dec 2018 > admitted    Admit date: 09/07/2017  Discharge date: 09/10/2017  Recommendations for Outpatient Follow-up:  1. Follow up with PCP in 1 week for BMP to follow-up electrolytes and creatinine after starting daily Lasix 2. Recommend outpatient sleep study 3. Pulmonology follow up for PFTs after resolution of acute illness    Brief/Interim Summary:  The patient is a 57 year old female with history of hypertension and family history of asthma who presented with a 2-week history of cough, wheezing, and shortness of breath. She had gone to  her primary care doctor who prescribed her antibiotics but she did not improve so she presented to the emergency department. Chest x-ray was unremarkable. She was wheezing and started on steroids and nebulizer treatments. D-dimer was elevated however her CT angiogram chest was negative for pulmonary embolism. She was treated for acute bronchitis/asthma at the direction of pulmonary/critical care. Additionally, she complained of some swelling of her lower extremities and some abdominal fullness. She was also given some diuretics due to concerns for acute diastolic heart failure. Her echocardiogram demonstrated grade 1 diastolic dysfunction with a dilated IVC and decreased compressibility of the IVC suggestive of elevated CVP. At the time of discharge, she was able to ambulate in the halls maintaining oxygen saturations 97% and higher without significant dyspnea.  Discharge Diagnoses:  Principal Problem:  Acute bronchitis  Active Problems:  Hypokalemia  Acute respiratory failure with hypoxia (HCC)  Essential hypertension   Acute respiratory failure with hypoxia likely secondary to new diagnosis of asthma, exacerbation triggered by acute bronchitis. She has a strong family history of asthma although she herself is never had wheezing. Respiratory viral panel was negative.  - Continue azithromycin to complete course for pneumonia/bronchitis  - Prednisone burst  - Continue home glycopyrrolate-formoterol and prn albuterol  - Follow-up with pulmonology as an outpatient to schedule pulmonary function tests  - If cough and wheeze continue despite steroids and antibiotics, consider discontinuing her lisinopril  Nocturnal desaturations, intermittent down to the mid 80s usually but she had a few episodes where she would transiently go down to the high 70s with good waveform.  -Recommend outpatient sleep study  Hypokalemia, likely secondary to patient's hydrochlorothiazide, improved with potassium supplementation   Hypertension, uncontrolled. Patient did not want to take amlodipine because she felt like her symptoms worsened. She wants to continue lisinopril. Continue IV hydralazine as needed. Hydrochlorothiazide changed to Lasix for better diuresis. Anticipate that her blood pressure may start to trend down after she stops her steroids. Will defer further management to her PCP.  Acute diastolic heart failure  -Echocardiogram: Grade 1 diastolic dysfunction, suggestion of elevated left ventricular filling pressures. IVC was dilated and blunted suggestive of elevated CVP. Also have some mildly elevated pulmonary arterial pressures consistent with mild pulmonary arterial hypertension.  -Weight is 89.1 kg on the date of discharge  -Stopped her HCTZ and started her on Lasix 40 mg once daily  Leukocytosis likely secondary to steroids   10/16/2017 ov feeling better with losartan x 2 weeks in place of lisinopril but still some sensation of choking esp at hs and not taking ppi as rec.  Cough is dry/ raspy also worse at hs Doe  = MMRC2 = can't walk a nl pace on a flat grade s sob but does fine slow and flat   No obvious day to day or daytime variability or assoc excess/ purulent sputum or mucus plugs or hemoptysis or cp or chest tightness, subjective wheeze or overt sinus or hb symptoms. No unusual exposure hx or h/o childhood pna/ asthma or knowledge of premature  birth.   . Also denies any obvious fluctuation of symptoms with weather or environmental changes or other aggravating or alleviating factors except as outlined above   Current Allergies, Complete Past Medical History, Past Surgical History, Family History, and Social History were reviewed in [facility] electronic medical record.    ROS  The following are not active complaints unless bolded Hoarseness, sore throat, dysphagia, dental problems, itching, sneezing,  nasal congestion or discharge of excess mucus or purulent secretions, ear ache,   fever,  chills, sweats, unintended wt loss or wt gain, classically pleuritic or exertional cp,  orthopnea pnd or leg swelling, presyncope, palpitations, abdominal pain, anorexia, nausea, vomiting, diarrhea  or change in bowel habits or change in bladder habits, change in stools or change in urine, dysuria, hematuria,  rash, arthralgias, visual complaints, headache, numbness, weakness or ataxia or problems with walking or coordination,  change in mood/affect or memory.        Current Meds  Medication Sig  . benzonatate (TESSALON) 100 MG capsule Take 1 capsule (100 mg total) by mouth 3 (three) times daily as needed for cough.  . cetirizine (ZYRTEC) 10 MG tablet Take 10 mg by mouth daily.  . cyclobenzaprine (FLEXERIL) 10 MG tablet Take 10 mg by mouth 3 (three) times daily as needed for muscle spasms.  . furosemide (LASIX) 40 MG tablet Take 40 mg by mouth daily.  Marland Kitchen. ibuprofen (ADVIL,MOTRIN) 800 MG tablet Take 800 mg by mouth every 8 (eight) hours as needed for moderate pain.  Marland Kitchen. losartan (COZAAR) 100 MG tablet Take 100 mg by mouth daily. as directed  . Multiple Vitamin (MULTIVITAMIN WITH MINERALS) TABS tablet Take 1 tablet by mouth daily.  Marland Kitchen. omeprazole (PRILOSEC) 40 MG capsule Take 1 capsule (40 mg total) by mouth daily.  Marland Kitchen. OVER THE COUNTER MEDICATION Take 1 capsule by mouth 3 (three) times daily. Bio-X4  . spironolactone (ALDACTONE) 50 MG tablet Take 50 mg by mouth daily.  Marland Kitchen.  ] fluticasone furoate-vilanterol (BREO ELLIPTA) 100-25 MCG/INH AEPB Inhale 1 puff into the lungs daily.          Physical Exam:  amb mod obese bf nad  Wt Readings from Last 3 Encounters:  10/16/17 193 lb 3.2 oz (87.6 kg)  09/30/17 194 lb 6.4 oz (88.2 kg)  09/10/17 196 lb 6.9 oz (89.1 kg)     Vital signs reviewed - Note on arrival 02 sats  96% on RA     HEENT: nl dentition, turbinates bilaterally, and oropharynx. Nl external ear canals without cough reflex   NECK :  without JVD/Nodes/TM/ nl carotid upstrokes  bilaterally   LUNGS: no acc muscle use,  Nl contour chest which is clear to A and P bilaterally without cough on insp or exp maneuvers   CV:  RRR  no s3 or murmur or increase in P2, and no edema   ABD:  soft and nontender with nl inspiratory excursion in the supine position. No bruits or organomegaly appreciated, bowel sounds nl  MS:  Nl gait/ ext warm without deformities, calf tenderness, cyanosis or clubbing No obvious joint restrictions   SKIN: warm and dry without lesions    NEURO:  alert, approp, nl sensorium with  no motor or cerebellar deficits apparent.         Assessment/Plan

## 2017-10-20 ENCOUNTER — Other Ambulatory Visit: Payer: Self-pay | Admitting: *Deleted

## 2017-10-20 DIAGNOSIS — R05 Cough: Secondary | ICD-10-CM

## 2017-10-20 DIAGNOSIS — G4733 Obstructive sleep apnea (adult) (pediatric): Secondary | ICD-10-CM

## 2017-10-20 DIAGNOSIS — R059 Cough, unspecified: Secondary | ICD-10-CM

## 2017-10-20 DIAGNOSIS — I272 Pulmonary hypertension, unspecified: Secondary | ICD-10-CM

## 2017-11-13 ENCOUNTER — Ambulatory Visit (INDEPENDENT_AMBULATORY_CARE_PROVIDER_SITE_OTHER): Payer: BLUE CROSS/BLUE SHIELD | Admitting: Internal Medicine

## 2017-11-13 ENCOUNTER — Encounter: Payer: Self-pay | Admitting: Internal Medicine

## 2017-11-13 ENCOUNTER — Other Ambulatory Visit (INDEPENDENT_AMBULATORY_CARE_PROVIDER_SITE_OTHER): Payer: BLUE CROSS/BLUE SHIELD

## 2017-11-13 VITALS — BP 140/88 | HR 85 | Ht 65.0 in | Wt 193.0 lb

## 2017-11-13 DIAGNOSIS — J45991 Cough variant asthma: Secondary | ICD-10-CM

## 2017-11-13 DIAGNOSIS — R05 Cough: Secondary | ICD-10-CM | POA: Diagnosis not present

## 2017-11-13 DIAGNOSIS — R059 Cough, unspecified: Secondary | ICD-10-CM

## 2017-11-13 LAB — CBC WITH DIFFERENTIAL/PLATELET
BASOS ABS: 0.1 10*3/uL (ref 0.0–0.1)
BASOS PCT: 0.9 % (ref 0.0–3.0)
Eosinophils Absolute: 0.5 10*3/uL (ref 0.0–0.7)
Eosinophils Relative: 7 % — ABNORMAL HIGH (ref 0.0–5.0)
HCT: 35.6 % — ABNORMAL LOW (ref 36.0–46.0)
HEMOGLOBIN: 11.6 g/dL — AB (ref 12.0–15.0)
Lymphocytes Relative: 26.2 % (ref 12.0–46.0)
Lymphs Abs: 1.8 10*3/uL (ref 0.7–4.0)
MCHC: 32.7 g/dL (ref 30.0–36.0)
MCV: 89.1 fl (ref 78.0–100.0)
MONO ABS: 0.5 10*3/uL (ref 0.1–1.0)
Monocytes Relative: 7.9 % (ref 3.0–12.0)
Neutro Abs: 4 10*3/uL (ref 1.4–7.7)
Neutrophils Relative %: 58 % (ref 43.0–77.0)
Platelets: 341 10*3/uL (ref 150.0–400.0)
RBC: 4 Mil/uL (ref 3.87–5.11)
RDW: 13.6 % (ref 11.5–15.5)
WBC: 7 10*3/uL (ref 4.0–10.5)

## 2017-11-13 MED ORDER — BUDESONIDE-FORMOTEROL FUMARATE 160-4.5 MCG/ACT IN AERO: 2.0000 | INHALATION_SPRAY | Freq: Two times a day (BID) | RESPIRATORY_TRACT | 0 refills | Status: DC

## 2017-11-13 MED ORDER — BUDESONIDE-FORMOTEROL FUMARATE 160-4.5 MCG/ACT IN AERO: 2.0000 | INHALATION_SPRAY | Freq: Two times a day (BID) | RESPIRATORY_TRACT | 11 refills | Status: DC

## 2017-11-13 MED ORDER — BENZONATATE 200 MG PO CAPS: 200.0000 mg | ORAL_CAPSULE | Freq: Three times a day (TID) | ORAL | 1 refills | Status: DC | PRN

## 2017-11-13 MED ORDER — PREDNISONE 10 MG PO TABS: ORAL_TABLET | ORAL | 0 refills | Status: DC

## 2017-11-13 NOTE — Patient Instructions (Addendum)
Prednisone 10 mg take  4 each am x 2 days,   2 each am x 2 days,  1 each am x 2 days and stop   Change symbicort to 160 Take 2 puffs first thing in am and then another 2 puffs about 12 hours later.   Please see patient coordinator before you leave today  to schedule sinus CT   Please remember to go to the lab department downstairs in the basement  for your tests - we will call you with the results when they are available.  Please schedule a follow up office visit in 6 weeks, sooner if needed  with all medications /inhalers/ solutions in hand so we can verify exactly what you are taking. This includes all medications from all doctors and over the counters

## 2017-11-13 NOTE — Progress Notes (Signed)
History of Present Illness Jillian Jefferson is a 57 y.o. female with history of HTN and family history of asthma. [facility]   10/01/2017 Hospital Follow Up: Admission Date: 09/07/2017 Discharge Date:  09/10/2017 Pt was started on Norvasc recently (a week ago) and within one day started to notice breathing difficulty. She saw a primary care doctor for this problem who continued her norvasc and added an inhaler which provided no relief so the patient stopped the norvasc. She does not use home oxygen at baseline and denies any history of breathing problems prior to this event. She states that on 12/10  the breathing got much worse, and she presented to the ED.Jillian Jefferson. She endorsed  being immobile for several days due to the inclement weather. She has not had any recent surgeries and denies leg pain, fever and previous history of clotting/bleeding disorders and malignancy.  She did  state that her shortness of breath was  accompanied by productive cough ( yellowish sputum sometimes blood streaked) Chills but no documented fevers at home.   CTA was negative for PE or pneumonia RVP negative  -BNP negative -No history of asthma, smoked only as a teenager but not for long.  Does have a significant family history of reactive airway disease.  Also has significant history of gastroesophageal reflux disease.  Plan/rec as an inpatient  Complete 5-day course of antibiotics for purulent bronchitis Taper steroids to off over 5 days Home with rescue bronchodilator, can discontinue scheduled bronchodilator Would send her home w/ PPI Will make her post-hospital f/u w/ plans to get PFTs; she is set up for Jan 2 at 2 pm    NP eval 09/30/17 post hosp: FENO = 80 Prednisone taper; 10 mg tablets: 4 tabs x 2 days, 3 tabs x 2 days, 2 tabs x 2 days 1 tab x 2 days then stop. Please schecdule for PFT's  ACE level today Schedule for sleep study Omeprazole 40 mg once daily Sips  of water instead of throat clearing Sugar free hard handy for throat soothing ( Jolly Ranchers) GERD Diet Avoid peppermint, Spearmint, Menthol,Chocolate Delsym 5 cc's every 12 hours Continue Breo 1 puff once daily Rinse mouth after use Tessalon Perles 100 mg three times daily as needed for cough  Do not drive if sleepy.    10/16/2017  f/u ov/Dadrian Jefferson re: cough > sob  Chief Complaint  Patient presents with  . Follow-up    PFT done today. Pt states she was better while on prednisone but once she finished, cough came back. States currently her cough is mild and nonproductive and has had some episodes of SOB.   57 yobf minimal smoker as teenager, good at sports with new onset winter time cough around 2016 and recurred while on lisinopril with noct Wheezing" chronically then much worse  around early Dec 2018 > admitted    Admit date: 09/07/2017  Discharge date: 09/10/2017    Recommendations for Outpatient Follow-up:  1. Follow up with PCP in 1 week for BMP to follow-up electrolytes and creatinine after starting daily Lasix 2. Recommend outpatient sleep study 3. Pulmonology follow up for PFTs after resolution of acute illness    Brief/Interim Summary:  The patient is a 57 year old female with history of hypertension and family history of asthma who presented with a 2-week history of cough, wheezing, and shortness of breath. She had gone to her primary care doctor who prescribed her antibiotics but she did not  improve so she presented to the emergency department. Chest x-ray was unremarkable. She was wheezing and started on steroids and nebulizer treatments. D-dimer was elevated however her CT angiogram chest was negative for pulmonary embolism. She was treated for acute bronchitis/asthma at the direction of pulmonary/critical care. Additionally, she complained of some swelling of her lower extremities and some abdominal fullness. She was also given some diuretics due to concerns for acute diastolic  heart failure. Her echocardiogram demonstrated grade 1 diastolic dysfunction with a dilated IVC and decreased compressibility of the IVC suggestive of elevated CVP. At the time of discharge, she was able to ambulate in the halls maintaining oxygen saturations 97% and higher without significant dyspnea.  Discharge Diagnoses:  Principal Problem:  Acute bronchitis  Active Problems:  Hypokalemia  Acute respiratory failure with hypoxia (HCC)  Essential hypertension   Acute respiratory failure with hypoxia likely secondary to new diagnosis of asthma, exacerbation triggered by acute bronchitis. She has a strong family history of asthma although she herself is never had wheezing. Respiratory viral panel was negative.  - Continue azithromycin to complete course for pneumonia/bronchitis  - Prednisone burst  - Continue home glycopyrrolate-formoterol and prn albuterol  - Follow-up with pulmonology as an outpatient to schedule pulmonary function tests  - If cough and wheeze continue despite steroids and antibiotics, consider discontinuing her lisinopril  Nocturnal desaturations, intermittent down to the mid 80s usually but she had a few episodes where she would transiently go down to the high 70s with good waveform.  -Recommend outpatient sleep study  Hypokalemia, likely secondary to patient's hydrochlorothiazide, improved with potassium supplementation  Hypertension, uncontrolled. Patient did not want to take amlodipine because she felt like her symptoms worsened. She wants to continue lisinopril. Continue IV hydralazine as needed. Hydrochlorothiazide changed to Lasix for better diuresis. Anticipate that her blood pressure may start to trend down after she stops her steroids. Will defer further management to her PCP.  Acute diastolic heart failure  -Echocardiogram: Grade 1 diastolic dysfunction, suggestion of elevated left ventricular filling pressures. IVC was dilated and blunted suggestive of elevated CVP.  Also have some mildly elevated pulmonary arterial pressures consistent with mild pulmonary arterial hypertension.  -Weight is 89.1 kg on the date of discharge  -Stopped her HCTZ and started her on Lasix 40 mg once daily  Leukocytosis likely secondary to steroids   10/16/2017 ov feeling better with losartan x 2 weeks in place of lisinopril but still some sensation of choking esp at hs and not taking ppi as rec.  Cough is dry/ raspy also worse at hs Doe  = MMRC2 = can't walk a nl pace on a flat grade s sob but does fine slow and flat  rec Plan A = Automatic = symbicort 80 Take 2 puffs first thing in am and then another 2 puffs about 12 hours later.  Prilosec 40 mg Take 30-60 min before first meal of the day and add pepcid 20 mg at bedtime until no cough at all for a week then stop it  work on inhaler technique:     Plan B = Backup Only use your albuterol (ventolin)as a rescue medication  GERD diet  Please schedule a follow up office visit in 4 weeks, sooner if needed  with all medications /inhalers/ solutions in hand so we can verify exactly what you are taking. This includes all medications from all doctors and over the counters    11/13/2017  f/u ov/Nicklaus Alviar re: chronic cough recurrent x 2016/ tends  to flair in winter / did not bring meds as req Chief Complaint  Patient presents with  . Follow-up    She states she started coughing last night after being exposed to cold weather. She is unsure of sputum color. She states that she had an episode 2 wks ago where she woke up wheezing and had chest tightness- albuterol helped some. She is using her albuterol 1-2 x per wk on average.   Dyspnea:   MMRC1 = can walk nl pace, flat grade, can't hurry or go uphills or steps s sob   Cough: sporadic but  def worse when exp to cold air  Sleep: cough esp 3am  Most am's    No obvious other patterns  day to day or daytime variability or assoc excess/ purulent sputum or mucus plugs or hemoptysis or cp or chest  tightness, subjective wheeze or overt sinus or hb symptoms. No unusual exposure hx or h/o childhood pna/ asthma or knowledge of premature birth.   . Also denies any obvious fluctuation of symptoms with weather or environmental changes or other aggravating or alleviating factors except as outlined above   Current Allergies, Complete Past Medical History, Past Surgical History, Family History, and Social History were reviewed in [employer] record.  ROS  The following are not active complaints unless bolded Hoarseness, sore throat, dysphagia, dental problems, itching, sneezing,  nasal congestion or discharge of excess mucus or purulent secretions, ear ache,   fever, chills, sweats, unintended wt loss or wt gain, classically pleuritic or exertional cp,  orthopnea pnd or leg swelling, presyncope, palpitations, abdominal pain, anorexia, nausea, vomiting, diarrhea  or change in bowel habits or change in bladder habits, change in stools or change in urine, dysuria, hematuria,  rash, arthralgias, visual complaints, headache, numbness, weakness or ataxia or problems with walking or coordination,  change in mood/affect or memory.        Current Meds  Medication Sig  . albuterol (PROVENTIL HFA;VENTOLIN HFA) 108 (90 Base) MCG/ACT inhaler Inhale 2 puffs into the lungs every 6 (six) hours as needed for wheezing or shortness of breath.  . cetirizine (ZYRTEC) 10 MG tablet Take 10 mg by mouth daily.  . cyclobenzaprine (FLEXERIL) 10 MG tablet Take 10 mg by mouth 3 (three) times daily as needed for muscle spasms.  . famotidine (PEPCID) 20 MG tablet One at bedtime  . furosemide (LASIX) 40 MG tablet Take 40 mg by mouth daily.  Jillian Jefferson ibuprofen (ADVIL,MOTRIN) 800 MG tablet Take 800 mg by mouth every 8 (eight) hours as needed for moderate pain.  Jillian Jefferson losartan (COZAAR) 100 MG tablet Take 100 mg by mouth daily. as directed  . Multiple Vitamin (MULTIVITAMIN WITH MINERALS) TABS tablet Take 1 tablet by mouth  daily.  Jillian Jefferson omeprazole (PRILOSEC) 40 MG capsule Take 1 capsule (40 mg total) by mouth daily.  Jillian Jefferson OVER THE COUNTER MEDICATION Take 1 capsule by mouth 3 (three) times daily. Bio-X4  . spironolactone (ALDACTONE) 50 MG tablet Take 50 mg by mouth daily.  Jillian Jefferson  ] benzonatate (TESSALON) 100 MG capsule Take 1 capsule (100 mg total) by mouth 3 (three) times daily as needed for cough.  . [  budesonide-formoterol (SYMBICORT) 80-4.5 MCG/ACT inhaler Inhale 2 puffs into the lungs 2 (two) times daily.             Physical Exam:  amb bf nad  11/13/2017        193   10/16/17 193 lb 3.2 oz (87.6 kg)  09/30/17 194 lb 6.4 oz (88.2 kg)  09/10/17 196 lb 6.9 oz (89.1 kg)     Vital signs reviewed - Note on arrival 02 sats  99% on RA   HEENT: nl dentition,  and oropharynx. Nl external ear canals without cough reflex - moderate bilateral non-specific turbinate edema     NECK :  without JVD/Nodes/TM/ nl carotid upstrokes bilaterally   LUNGS: no acc muscle use,  Nl contour chest which is clear to A and P bilaterally without cough on insp or exp maneuvers   CV:  RRR  no s3 or murmur or increase in P2, and no edema   ABD:  soft and nontender with nl inspiratory excursion in the supine position. No bruits or organomegaly appreciated, bowel sounds nl  MS:  Nl gait/ ext warm without deformities, calf tenderness, cyanosis or clubbing No obvious joint restrictions   SKIN: warm and dry without lesions    NEURO:  alert, approp, nl sensorium with  no motor or cerebellar deficits apparent.      .    Labs ordered 11/13/2017  Allergy profile         Assessment/Plan

## 2017-11-14 ENCOUNTER — Encounter: Payer: Self-pay | Admitting: Internal Medicine

## 2017-11-14 NOTE — Assessment & Plan Note (Signed)
Off ACEi ? 09/2017  - PFT's 10/16/2017  wnl on BREO but still coughing  - 10/16/2017    changed breo to symbicort 80 2bid due to cough  - 11/13/2017  After extensive coaching inhaler device  effectiveness =    90% try increase symbicort 160 2bid due to noct cough on a trial basis  - Spirometry 11/13/2017  FEV1 1.79 (78%)  Ratio 81 with slight curvature p am symb 80 x2    - Allergy profile 11/13/2017 >  Eos 0.5 /  IgE   - sinus CT 11/13/2017 >>>   All goals of chronic asthma control are not all being met in view of noct cough and continued doe so reasonable to try the higher dose ics then consider adding singulair  Contingencies discussed in full including contacting this office immediately if not controlling the symptoms using the rule of two's.     I had an extended discussion with the patient reviewing all relevant studies completed to date and  lasting 15 to 20 minutes of a 25 minute visit    Each maintenance medication was reviewed in detail including most importantly the difference between maintenance and prns and under what circumstances the prns are to be triggered using an action plan format that is not reflected in the computer generated alphabetically organized AVS.    Please see AVS for specific instructions unique to this visit that I personally wrote and verbalized to the the pt in detail and then reviewed with pt  by my nurse highlighting any  changes in therapy recommended at today's visit to their plan of care.

## 2017-11-16 LAB — RESPIRATORY ALLERGY PROFILE REGION II ~~LOC~~
Allergen, A. alternata, m6: <0.1 kU/L
Allergen, Comm Silver Birch, t9: <0.1 kU/L
Allergen, D pternoyssinus,d7: <0.1 kU/L
Allergen, P. notatum, m1: <0.1 kU/L
Box Elder IgE: <0.1 kU/L
CLADOSPORIUM HERBARUM (M2) IGE: <0.1 kU/L
CLASS: 0
CLASS: 0
CLASS: 0
CLASS: 0
CLASS: 0
CLASS: 0
CLASS: 0
CLASS: 0
CLASS: 0
CLASS: 0
CLASS: 0
CLASS: 0
CLASS: 0
CLASS: 0
CLASS: 0
CLASS: 0
COMMON RAGWEED (SHORT) (W1) IGE: <0.1 kU/L
Class: 0
Class: 0
Class: 0
Class: 0
Class: 0
Class: 0
Class: 0
Class: 0
D. farinae: <0.1 kU/L
Elm IgE: <0.1 kU/L
IgE (Immunoglobulin E), Serum: 81 kU/L (ref <=114)
Johnson Grass: <0.1 kU/L
Pecan/Hickory Tree IgE: <0.1 kU/L

## 2017-11-16 LAB — INTERPRETATION:

## 2017-11-17 ENCOUNTER — Telehealth: Payer: Self-pay | Admitting: Internal Medicine

## 2017-11-17 NOTE — Telephone Encounter (Signed)
Called and spoke with patient, she was needing results from lab work. Patient aware, verbalized understanding, nothing further needed.

## 2017-11-17 NOTE — Progress Notes (Signed)
ATC, NA no VM set up

## 2017-11-19 ENCOUNTER — Ambulatory Visit (INDEPENDENT_AMBULATORY_CARE_PROVIDER_SITE_OTHER)
Admission: RE | Admit: 2017-11-19 | Discharge: 2017-11-19 | Disposition: A | Discharge status: Home / Self Care | Payer: BLUE CROSS/BLUE SHIELD | Source: Ambulatory Visit | Attending: Internal Medicine | Admitting: Internal Medicine

## 2017-11-19 DIAGNOSIS — R05 Cough: Secondary | ICD-10-CM

## 2017-11-19 DIAGNOSIS — J45991 Cough variant asthma: Secondary | ICD-10-CM

## 2017-11-19 DIAGNOSIS — R059 Cough, unspecified: Secondary | ICD-10-CM

## 2017-11-20 NOTE — Progress Notes (Signed)
Was able to talk to the patient regarding their results.  They verbalized an understanding of what was discussed. No further questions at this time. 

## 2017-11-24 ENCOUNTER — Telehealth: Payer: Self-pay | Admitting: Internal Medicine

## 2017-11-24 MED ORDER — MONTELUKAST SODIUM 10 MG PO TABS: 10.0000 mg | ORAL_TABLET | Freq: Every day | ORAL | 11 refills | Status: DC

## 2017-11-24 MED ORDER — PREDNISONE 10 MG PO TABS: ORAL_TABLET | ORAL | 0 refills | Status: DC

## 2017-11-24 NOTE — Telephone Encounter (Signed)
Pt states she completed Prednisone taper and cough-productive-yellow in color-with wheezing, chest tightness. Pt denies any fever, chills, or body aches.   Pt felt better on Prednisone.  Please advise MW. Thanks.

## 2017-11-24 NOTE — Telephone Encounter (Signed)
Spoke with patient. She stated that she is not taking the Singulair at night because it was never prescribed for her. She wishes to proceed with the Singulair and Prednisone taper.   Will [facility] [place].   Nothing else needed at time of call.

## 2017-11-24 NOTE — Telephone Encounter (Signed)
Strongly prefer ov with all meds in hand to regroup If can't do this on our end or she delcines ov then start singulair 10 mg each pm as per my last ov a/p and just give Prednisone 10 mg take  4 each am x 2 days,   2 each am x 2 days,  1 each am x 2 days and stop

## 2017-12-25 ENCOUNTER — Ambulatory Visit (INDEPENDENT_AMBULATORY_CARE_PROVIDER_SITE_OTHER): Payer: BLUE CROSS/BLUE SHIELD | Admitting: Internal Medicine

## 2017-12-25 ENCOUNTER — Encounter: Payer: Self-pay | Admitting: Internal Medicine

## 2017-12-25 ENCOUNTER — Ambulatory Visit: Payer: BLUE CROSS/BLUE SHIELD | Admitting: Internal Medicine

## 2017-12-25 VITALS — BP 154/98 | HR 103 | Ht 65.0 in | Wt 190.6 lb

## 2017-12-25 DIAGNOSIS — I10 Essential (primary) hypertension: Secondary | ICD-10-CM

## 2017-12-25 DIAGNOSIS — J45991 Cough variant asthma: Secondary | ICD-10-CM

## 2017-12-25 LAB — NITRIC OXIDE: NITRIC OXIDE: 97

## 2017-12-25 LAB — PULMONARY FUNCTION TEST
FEF 25-75 Pre: 2.59 L/sec
FEF2575-%Pred-Pre: 110 %
FEV1-%PRED-PRE: 88 %
FEV1-Pre: 2.04 L
FEV1FVC-%PRED-PRE: 109 %
FEV6-%PRED-PRE: 82 %
FEV6-PRE: 2.32 L
FEV6FVC-%Pred-Pre: 103 %
FVC-%PRED-PRE: 80 %
FVC-PRE: 2.33 L
Pre FEV1/FVC ratio: 88 %
Pre FEV6/FVC Ratio: 100 %

## 2017-12-25 MED ORDER — PREDNISONE 10 MG PO TABS: ORAL_TABLET | ORAL | 0 refills | Status: DC

## 2017-12-25 MED ORDER — BUDESONIDE-FORMOTEROL FUMARATE 80-4.5 MCG/ACT IN AERO: 2.0000 | INHALATION_SPRAY | Freq: Two times a day (BID) | RESPIRATORY_TRACT | 0 refills | Status: DC

## 2017-12-25 NOTE — Progress Notes (Signed)
Was able to talk to the patient regarding their results.  They verbalized an understanding of what was discussed. No further questions at this time. 

## 2017-12-25 NOTE — Progress Notes (Signed)
Spirometry done today. 

## 2017-12-25 NOTE — Progress Notes (Signed)
History of Present Illness  72 yobf  history of HTN on  Chronic ACEi and family history of asthma smoked only as teenager with new pattern of winter time tendency for colds to go down to chest x age 57 first started in [place] and moved to [place] around 2015 pattern had continued while on ACEi leading up to admit:   Admit date: 09/07/2017 Discharge date: 09/10/2017    Brief/Interim Summary: The patient is a 57 year old female with history of hypertension and family history of asthma who presented with a 2-week history of cough, wheezing, and shortness of breath. She had gone to her primary care doctor who prescribed her antibiotics but she did not improve so she presented to the emergency department. Chest x-ray was unremarkable. She was wheezing and started on steroids and nebulizer treatments. D-dimer was elevated however her CT angiogram chest was negative for pulmonary embolism. She was treated for acute bronchitis/asthma at the direction of pulmonary/critical care.   Additionally, she complained of some swelling of her lower extremities and some abdominal fullness.  She was also given some diuretics due to concerns for acute diastolic heart failure.  Her echocardiogram demonstrated grade 1 diastolic dysfunction with a dilated IVC and decreased compressibility of the IVC suggestive of elevated CVP.  At the time of discharge, she was able to ambulate in the halls maintaining oxygen saturations 97% and higher without significant dyspnea.  Discharge Diagnoses:  Principal Problem:   Acute bronchitis Active Problems:   Hypokalemia   Acute respiratory failure with hypoxia (HCC)   Essential hypertension  Acute respiratory failure with hypoxia likely secondary to new diagnosis of asthma, exacerbation triggered by acute bronchitis. She has a strong family history of asthma although she herself is never had wheezing. Respiratory viral panel was negative.  -Continue azithromycin to  complete course for pneumonia/bronchitis -  Prednisone burst -  Continue home glycopyrrolate-formoterol and prn albuterol  - Follow-up with pulmonology as an outpatient to schedule pulmonary function tests -  If cough and wheeze continue despite steroids and antibiotics, consider discontinuing her lisinopril  Nocturnal desaturations, intermittent down to the mid 80s usually but she had a few episodes where she would transiently go down to the high 70s with good waveform. -Recommend outpatient sleep study  Hypokalemia, likely secondary to patient's hydrochlorothiazide, improved with potassium supplementation  Hypertension, uncontrolled. Patient did not want to take amlodipine because she felt like her symptoms worsened. She wants to continue lisinopril. Continue IV hydralazine as needed.  Hydrochlorothiazide changed to Lasix for better diuresis.  Anticipate that her blood pressure may start to trend down after she stops her steroids.  Will defer further management to her PCP.  Acute diastolic heart failure -Echocardiogram:Grade 1 diastolic dysfunction, suggestion of elevated left ventricular filling pressures. IVC was dilated and blunted suggestive of elevated CVP. Also have some mildly elevated pulmonary arterial pressures consistent with mild pulmonary arterial hypertension. -Weight is 89.1 kg on the date of discharge -Stopped her HCTZ and started her on Lasix 40 mg once daily  She was seen in the hospital by [facility] and Anders Simmonds   10/01/2017 Hospital Follow Up: Admission Date: 09/07/2017 Discharge Date:  09/10/2017 Pt was started on Norvasc recently (a week ago) and within one day started to notice breathing difficulty. She saw a primary care doctor for this problem who continued her norvasc and added an inhaler which provided no relief so the patient stopped the norvasc. She does not use home oxygen at baseline and denies  any history of breathing problems prior to this  event. She states that on 12/10  the breathing got much worse, and she presented to the ED.Marland Kitchen She endorsed  being immobile for several days due to the inclement weather. She has not had any recent surgeries and denies leg pain, fever and previous history of clotting/bleeding disorders and malignancy.  She did  state that her shortness of breath was  accompanied by productive cough ( yellowish sputum sometimes blood streaked) Chills but no documented fevers at home.   CTA was negative for PE or pneumonia RVP negative  -BNP negative    Plan/rec as an inpatient  Complete 5-day course of antibiotics for purulent bronchitis Taper steroids to off over 5 days Home with rescue bronchodilator, can discontinue scheduled bronchodilator Would send her home w/ PPI Will make her post-hospital f/u w/ plans to get PFTs; she is set up for Jan 2 at 2 pm    NP eval 09/30/17 post hosp: FENO = 80 Prednisone taper; 10 mg tablets: 4 tabs x 2 days, 3 tabs x 2 days, 2 tabs x 2 days 1 tab x 2 days then stop. Please schecdule for PFT's  ACE level today Schedule for sleep study Omeprazole 40 mg once daily Sips of water instead of throat clearing Sugar free hard handy for throat soothing ( Jolly Ranchers) GERD Diet Avoid peppermint, Spearmint, Menthol,Chocolate Delsym 5 cc's every 12 hours Continue Breo 1 puff once daily Rinse mouth after use Tessalon Perles 100 mg three times daily as needed for cough  Do not drive if sleepy.    10/16/2017  f/u ov/Jillian Jefferson re: cough > sob  Chief Complaint  Patient presents with  . Follow-up    PFT done today. Pt states she was better while on prednisone but once she finished, cough came back. States currently her cough is mild and nonproductive and has had some episodes of SOB.   57 yobf minimal smoker as teenager, good at sports with new onset winter time cough around 2016 and recurred while on lisinopril with noct Wheezing" chronically then much worse  around early Dec 2018  > admitted    Admit date: 09/07/2017  Discharge date: 09/10/2017    Recommendations for Outpatient Follow-up:  1. Follow up with PCP in 1 week for BMP to follow-up electrolytes and creatinine after starting daily Lasix 2. Recommend outpatient sleep study 3. Pulmonology follow up for PFTs after resolution of acute illness    Brief/Interim Summary:  The patient is a 57 year old female with history of hypertension and family history of asthma who presented with a 2-week history of cough, wheezing, and shortness of breath. She had gone to her primary care doctor who prescribed her antibiotics but she did not improve so she presented to the emergency department. Chest x-ray was unremarkable. She was wheezing and started on steroids and nebulizer treatments. D-dimer was elevated however her CT angiogram chest was negative for pulmonary embolism. She was treated for acute bronchitis/asthma at the direction of pulmonary/critical care. Additionally, she complained of some swelling of her lower extremities and some abdominal fullness. She was also given some diuretics due to concerns for acute diastolic heart failure. Her echocardiogram demonstrated grade 1 diastolic dysfunction with a dilated IVC and decreased compressibility of the IVC suggestive of elevated CVP. At the time of discharge, she was able to ambulate in the halls maintaining oxygen saturations 97% and higher without significant dyspnea.  Discharge Diagnoses:  Principal Problem:  Acute bronchitis  Active Problems:  Hypokalemia  Acute respiratory failure with hypoxia (HCC)  Essential hypertension   Acute respiratory failure with hypoxia likely secondary to new diagnosis of asthma, exacerbation triggered by acute bronchitis. She has a strong family history of asthma although she herself is never had wheezing. Respiratory viral panel was negative.  - Continue azithromycin to complete course for pneumonia/bronchitis  - Prednisone burst  -  Continue home glycopyrrolate-formoterol and prn albuterol  - Follow-up with pulmonology as an outpatient to schedule pulmonary function tests  - If cough and wheeze continue despite steroids and antibiotics, consider discontinuing her lisinopril  Nocturnal desaturations, intermittent down to the mid 80s usually but she had a few episodes where she would transiently go down to the high 70s with good waveform.  -Recommend outpatient sleep study  Hypokalemia, likely secondary to patient's hydrochlorothiazide, improved with potassium supplementation  Hypertension, uncontrolled. Patient did not want to take amlodipine because she felt like her symptoms worsened. She wants to continue lisinopril. Continue IV hydralazine as needed. Hydrochlorothiazide changed to Lasix for better diuresis. Anticipate that her blood pressure may start to trend down after she stops her steroids. Will defer further management to her PCP.  Acute diastolic heart failure  -Echocardiogram: Grade 1 diastolic dysfunction, suggestion of elevated left ventricular filling pressures. IVC was dilated and blunted suggestive of elevated CVP. Also have some mildly elevated pulmonary arterial pressures consistent with mild pulmonary arterial hypertension.  -Weight is 89.1 kg on the date of discharge  -Stopped her HCTZ and started her on Lasix 40 mg once daily  Leukocytosis likely secondary to steroids   10/16/2017 ov feeling better with losartan x 2 weeks in place of lisinopril but still some sensation of choking esp at hs and not taking ppi as rec.  Cough is dry/ raspy also worse at hs Doe  = MMRC2 = can't walk a nl pace on a flat grade s sob but does fine slow and flat  rec Plan A = Automatic = symbicort 80 Take 2 puffs first thing in am and then another 2 puffs about 12 hours later.  Prilosec 40 mg Take 30-60 min before first meal of the day and add pepcid 20 mg at bedtime until no cough at all for a week then stop it  work on inhaler  technique:     Plan B = Backup Only use your albuterol (ventolin)as a rescue medication  GERD diet  Please schedule a follow up office visit in 4 weeks, sooner if needed  with all medications /inhalers/ solutions in hand so we can verify exactly what you are taking. This includes all medications from all doctors and over the counters     11/13/2017  f/u ov/Jillian Jefferson re: chronic cough recurrent x 2016/ tends to flair in winter / did not bring meds as req Chief Complaint  Patient presents with  . Follow-up    She states she started coughing last night after being exposed to cold weather. She is unsure of sputum color. She states that she had an episode 2 wks ago where she woke up wheezing and had chest tightness- albuterol helped some. She is using her albuterol 1-2 x per wk on average.   Dyspnea:   MMRC1 = can walk nl pace, flat grade, can't hurry or go uphills or steps s sob   Cough: sporadic but  def worse when exp to cold air  Sleep: cough esp 3am  Most am's  rec Prednisone 10 mg take  4 each am x 2  days,   2 each am x 2 days,  1 each am x 2 days and stop  Change symbicort to 160 Take 2 puffs first thing in am and then another 2 puffs about 12 hours later.  - Spirometry 11/13/2017  FEV1 1.79 (78%)  Ratio 81 with slight curvature p am symb 80 x2    - Allergy profile 11/13/2017 >  Eos 0.5 /  IgE  81 RAST neg  - sinus CT 11/19/2017 > neg  - Singulair started 11/24/17    12/25/2017  f/u ov/Jillian Jefferson re: uacs vs asthma  Off acei since around the 1st of 2019 / did not bring meds as req  Chief Complaint  Patient presents with  . Follow-up    6 week f/u , feeling better, singular helping, still having a cough but not as bad , mornings are hard but less SOB during the day   Dyspnea:  Really not limited by doe  Cough:after wakes about an hour feels need to "get it out" min white mucus prod o Sleep: ok now  SABA use:  Rare despite "wheeze" that doesn't get better with saba  Typically after supper feels  like she's wheezing right up until bedtime   No obvious day to day or daytime variability or assoc excess/ purulent sputum or mucus plugs or hemoptysis or cp or chest tightness, subjective wheeze or overt sinus or hb symptoms. No unusual exposure hx or h/o childhood pna/ asthma or knowledge of premature birth.  Sleeping ok flat without nocturnal  or early am exacerbation  of respiratory  c/o's or need for noct saba. Also denies any obvious fluctuation of symptoms with weather or environmental changes or other aggravating or alleviating factors except as outlined above   Current Allergies, Complete Past Medical History, Past Surgical History, Family History, and Social History were reviewed in [employer] record.  ROS  The following are not active complaints unless bolded Hoarseness, sore throat, dysphagia, dental problems, itching, sneezing,  nasal congestion or discharge of excess mucus or purulent secretions, ear ache,   fever, chills, sweats, unintended wt loss or wt gain, classically pleuritic or exertional cp,  orthopnea pnd or leg swelling, presyncope, palpitations, abdominal pain, anorexia, nausea, vomiting, diarrhea  or change in bowel habits or change in bladder habits, change in stools or change in urine, dysuria, hematuria,  rash, arthralgias, visual complaints, headache, numbness, weakness or ataxia or problems with walking or coordination,  change in mood/affect or memory.        Current Meds  Medication Sig  . albuterol (PROVENTIL HFA;VENTOLIN HFA) 108 (90 Base) MCG/ACT inhaler Inhale 2 puffs into the lungs every 6 (six) hours as needed for wheezing or shortness of breath.  . benzonatate (TESSALON) 200 MG capsule Take 1 capsule (200 mg total) by mouth 3 (three) times daily as needed for cough.  . cetirizine (ZYRTEC) 10 MG tablet Take 10 mg by mouth daily.  . cyclobenzaprine (FLEXERIL) 10 MG tablet Take 10 mg by mouth 3 (three) times daily as needed for muscle  spasms.  . famotidine (PEPCID) 20 MG tablet One at bedtime  . furosemide (LASIX) 40 MG tablet Take 40 mg by mouth daily.  Marland Kitchen ibuprofen (ADVIL,MOTRIN) 800 MG tablet Take 800 mg by mouth every 8 (eight) hours as needed for moderate pain.  Marland Kitchen losartan (COZAAR) 100 MG tablet Take 100 mg by mouth daily. as directed  . montelukast (SINGULAIR) 10 MG tablet Take 1 tablet (10 mg total) by mouth  at bedtime.  . Multiple Vitamin (MULTIVITAMIN WITH MINERALS) TABS tablet Take 1 tablet by mouth daily.  Marland Kitchen omeprazole (PRILOSEC) 40 MG capsule Take 1 capsule (40 mg total) by mouth daily.  Marland Kitchen spironolactone (ALDACTONE) 50 MG tablet Take 50 mg by mouth daily.  .   budesonide-formoterol (SYMBICORT) 160-4.5 MCG/ACT inhaler Inhale 2 puffs into the lungs 2 (two) times daily.  .     .    .                       Physical Exam:  amb bf nad with audible wheeze   12/25/2017        191  11/13/2017        193   10/16/17 193 lb 3.2 oz (87.6 kg)  09/30/17 194 lb 6.4 oz (88.2 kg)  09/10/17 196 lb 6.9 oz (89.1 kg)    Vital signs reviewed - Note on arrival 02 sats  96% on RA and bp 154/98 p no am meds        HEENT: nl dentition,  and oropharynx. Nl external ear canals without cough reflex - moderate bilateral non-specific turbinate edema     NECK :  without JVD/Nodes/TM/ nl carotid upstrokes bilaterally   LUNGS: no acc muscle use,  Nl contour chest with pseudowheeze only (confirmed on spirometry)   CV:  RRR  no s3 or murmur or increase in P2, and no edema   ABD:  soft and nontender with nl inspiratory excursion in the supine position. No bruits or organomegaly appreciated, bowel sounds nl  MS:  Nl gait/ ext warm without deformities, calf tenderness, cyanosis or clubbing No obvious joint restrictions   SKIN: warm and dry without lesions    NEURO:  alert, approp, nl sensorium with  no motor or cerebellar deficits apparent.            Assessment/Plan

## 2017-12-25 NOTE — Patient Instructions (Addendum)
Plan A = Automatic = symbicort 80 Take 2 puffs first thing in am and then another 2 puffs about 12 hours later.  Change the pepcid to where you take it after supper   GERD (REFLUX)  is an extremely common cause of respiratory symptoms just like yours , many times with no obvious heartburn at all.    It can be treated with medication, but also with lifestyle changes including elevation of the head of your bed (ideally with 6 inch  bed blocks),  Smoking cessation, avoidance of late meals, excessive alcohol, and avoid fatty foods, chocolate, peppermint, colas, red wine, and acidic juices such as orange juice.  NO MINT OR MENTHOL PRODUCTS SO NO COUGH DROPS   USE SUGARLESS CANDY INSTEAD (Jolley ranchers or Stover's or Life Savers) or even ice chips will also do - the key is to swallow to prevent all throat clearing. NO OIL BASED VITAMINS - use powdered substitutes.    Plan B = Backup Only use your albuterol as a rescue medication to be used if you can't catch your breath by resting or doing a relaxed purse lip breathing pattern.  - The less you use it, the better it will work when you need it. - Ok to use the inhaler up to 2 puffs  every 4 hours if you must but call for appointment if use goes up over your usual need - Don't leave home without it !!  (think of it like the spare tire for your car)   Plan C = Crisis -  If still not doing better > Prednisone 10 mg take  4 each am x 2 days,   2 each am x 2 days,  1 each am x 2 days and stop     Please schedule a follow up office visit in 2 weeks, sooner if needed  with all medications /inhalers/ solutions in hand so we can verify exactly what you are taking. This includes all medications from all doctors and over the counters

## 2017-12-26 ENCOUNTER — Encounter: Payer: Self-pay | Admitting: Internal Medicine

## 2017-12-26 NOTE — Assessment & Plan Note (Addendum)
Off ACEi ? 09/2017  - PFT's 10/16/2017  wnl on BREO but still coughing  - 10/16/2017    changed breo to symbicort 80 2bid due to cough  - 11/13/2017  After extensive coaching inhaler device  effectiveness =    90% try increase symbicort 160 2bid due to noct cough on a trial basis - Spirometry 11/13/2017  FEV1 1.79 (78%)  Ratio 81 with slight curvature p am symb 80 x2    - Allergy profile 11/13/2017 >  Eos 0.5 /  IgE  81 RAST neg  - sinus CT 11/19/2017 > neg  - Singulair started 11/24/17 - Spirometry 12/25/2017 wnl on symb 160 2bid with audible wheeze, no flattening of f/v - FENO 12/25/2017  =   97  - The proper method of use, as well as anticipated side effects, of a metered-dose inhaler are discussed and demonstrated to the patient. Improved effectiveness after extensive coaching during this visit to a level of approximately 90 % from a baseline of 50 % > continue hfa / avoid dpi which can aggravate uacs component of her problmes     Despite audible wheeze and elevated feno, there is no evidence of active airflow obstruction objectively and all her symptoms appear upper airway which may be made worse by high dose ics  So rec trial of symb 80 2bid and keep pred x 6 days on hand if gets worse on the lower dose  Critical that she return with all meds in hand using a trust but verify approach to confirm accurate Medication  Reconciliation The principal here is that until we are certain that the  patients are doing what we've asked, it makes no sense to ask them to do more.

## 2017-12-26 NOTE — Assessment & Plan Note (Signed)
Echo 09/09/17  - LVEF 65-70%, mild LVH, normal wall motion, grade 1 DD,   indeterminate LV filling pressure, mild LAE, trivial TR, RVSP 39   mmHg, dilated IVC suggestive of mild pulmonary hypertension.  Poorly controlled on ? meds > strongly doubt adherence. Need a trust but verify approach to med reconciliation. (see separate a/p)

## 2018-01-05 ENCOUNTER — Telehealth: Payer: Self-pay | Admitting: Internal Medicine

## 2018-01-05 NOTE — Telephone Encounter (Signed)
Called and spoke to patient. Patient stated she has been doing the plan laid out by Dr. Sherene SiresWert but will review further. Patient stated she lives too far away to come here for an acute visit but that she will make an appointment with a local doctors office if she feels she needs it. Patient has upcoming appointment and will continue to make that appointment as scheduled. Nothing further is needed at this time.

## 2018-01-05 NOTE — Telephone Encounter (Signed)
I gave her a Plan A thru C to try and if not better needs plan D = Doctor, needs to be seen   Review with her my last instructions to make sure she did them  Nothing else to offer over the phone

## 2018-01-05 NOTE — Telephone Encounter (Signed)
Called and spoke to patient. Patient described symptoms of sinus drainage, chest congestion, productive cough (yellow), and shortness of breath. Patient stated she has not had a fever. [place] for an appointment.   MW please advise. Thanks!

## 2018-01-11 ENCOUNTER — Encounter: Payer: Self-pay | Admitting: Internal Medicine

## 2018-01-11 ENCOUNTER — Ambulatory Visit: Payer: BLUE CROSS/BLUE SHIELD | Admitting: Internal Medicine

## 2018-01-11 VITALS — BP 130/82 | HR 80 | Temp 98.8°F | Ht 65.0 in | Wt 195.0 lb

## 2018-01-11 DIAGNOSIS — I10 Essential (primary) hypertension: Secondary | ICD-10-CM | POA: Diagnosis not present

## 2018-01-11 DIAGNOSIS — J45991 Cough variant asthma: Secondary | ICD-10-CM | POA: Diagnosis not present

## 2018-01-11 MED ORDER — BISOPROLOL FUMARATE 5 MG PO TABS: 5.0000 mg | ORAL_TABLET | Freq: Every day | ORAL | 11 refills | Status: DC

## 2018-01-11 MED ORDER — AZITHROMYCIN 250 MG PO TABS: ORAL_TABLET | ORAL | 0 refills | Status: DC

## 2018-01-11 MED ORDER — BUDESONIDE-FORMOTEROL FUMARATE 80-4.5 MCG/ACT IN AERO: INHALATION_SPRAY | RESPIRATORY_TRACT | 11 refills | Status: DC

## 2018-01-11 NOTE — Progress Notes (Signed)
History of Present Illness  [id number] yobf  history of HTN on  Chronic ACEi and family history of asthma smoked only as teenager with new pattern of winter time tendency for colds to go down to chest x age 57 [place] and moved to [place] around 2015 pattern had continued while on ACEi leading up to admit:   Admit date: 09/07/2017 Discharge date: 09/10/2017    Brief/Interim Summary: The patient is a 57 year old female with history of hypertension and family history of asthma who presented with a 2-week history of cough, wheezing, and shortness of breath. She had gone to her primary care doctor who prescribed her antibiotics but she did not improve so she presented to the emergency department. Chest x-ray was unremarkable. She was wheezing and started on steroids and nebulizer treatments. D-dimer was elevated however her CT angiogram chest was negative for pulmonary embolism. She was treated for acute bronchitis/asthma at the direction of pulmonary/critical care.   Additionally, she complained of some swelling of her lower extremities and some abdominal fullness.  She was also given some diuretics due to concerns for acute diastolic heart failure.  Her echocardiogram demonstrated grade 1 diastolic dysfunction with a dilated IVC and decreased compressibility of the IVC suggestive of elevated CVP.  At the time of discharge, she was able to ambulate in the halls maintaining oxygen saturations 97% and higher without significant dyspnea.  Discharge Diagnoses:  Principal Problem:   Acute bronchitis Active Problems:   Hypokalemia   Acute respiratory failure with hypoxia (HCC)   Essential hypertension  Acute respiratory failure with hypoxia likely secondary to new diagnosis of asthma, exacerbation triggered by acute bronchitis. She has a strong family history of asthma although she herself is never had wheezing. Respiratory viral panel was negative.  -Continue azithromycin to  complete course for pneumonia/bronchitis -  Prednisone burst -  Continue home glycopyrrolate-formoterol and prn albuterol  - Follow-up with pulmonology as an outpatient to schedule pulmonary function tests -  If cough and wheeze continue despite steroids and antibiotics, consider discontinuing her lisinopril  Nocturnal desaturations, intermittent down to the mid 80s usually but she had a few episodes where she would transiently go down to the high 70s with good waveform. -Recommend outpatient sleep study  Hypokalemia, likely secondary to patient's hydrochlorothiazide, improved with potassium supplementation  Hypertension, uncontrolled. Patient did not want to take amlodipine because she felt like her symptoms worsened. She wants to continue lisinopril. Continue IV hydralazine as needed.  Hydrochlorothiazide changed to Lasix for better diuresis.  Anticipate that her blood pressure may start to trend down after she stops her steroids.  Will defer further management to her PCP.  Acute diastolic heart failure -Echocardiogram:Grade 1 diastolic dysfunction, suggestion of elevated left ventricular filling pressures. IVC was dilated and blunted suggestive of elevated CVP. Also have some mildly elevated pulmonary arterial pressures consistent with mild pulmonary arterial hypertension. -Weight is 89.1 kg on the date of discharge -Stopped her HCTZ and started her on Lasix 40 mg once daily      NP eval 09/30/17 post hosp: FENO = 80 Prednisone taper; 10 mg tablets: 4 tabs x 2 days, 3 tabs x 2 days, 2 tabs x 2 days 1 tab x 2 days then stop. Please schecdule for PFT's  ACE level today Schedule for sleep study Omeprazole 40 mg once daily Sips of water instead of throat clearing Sugar free hard handy for throat soothing ( [employer]) GERD Diet Avoid peppermint, Spearmint, Menthol,Chocolate  Delsym 5 cc's every 12 hours Continue Breo 1 puff once daily Rinse mouth after use Tessalon Perles  100 mg three times daily as needed for cough  Do not drive if sleepy.      10/16/2017 ov feeling better with losartan x 2 weeks in place of lisinopril but still some sensation of choking esp at hs and not taking ppi as rec.  Cough is dry/ raspy also worse at hs Doe  = MMRC2 = can't walk a nl pace on a flat grade s sob but does fine slow and flat  rec Plan A = Automatic = symbicort 80 Take 2 puffs first thing in am and then another 2 puffs about 12 hours later.  Prilosec 40 mg Take 30-60 min before first meal of the day and add pepcid 20 mg at bedtime until no cough at all for a week then stop it  work on inhaler technique:     Plan B = Backup Only use your albuterol (ventolin)as a rescue medication  GERD diet  Please schedule a follow up office visit in 4 weeks, sooner if needed  with all medications /inhalers/ solutions in hand so we can verify exactly what you are taking. This includes all medications from all doctors and over the counters     11/13/2017  f/u ov/Wert re: chronic cough recurrent x 2016/ tends to flair in winter / did not bring meds as req Chief Complaint  Patient presents with  . Follow-up    She states she started coughing last night after being exposed to cold weather. She is unsure of sputum color. She states that she had an episode 2 wks ago where she woke up wheezing and had chest tightness- albuterol helped some. She is using her albuterol 1-2 x per wk on average.   Dyspnea:   MMRC1 = can walk nl pace, flat grade, can't hurry or go uphills or steps s sob   Cough: sporadic but  def worse when exp to cold air  Sleep: cough esp 3am  Most am's  rec Prednisone 10 mg take  4 each am x 2 days,   2 each am x 2 days,  1 each am x 2 days and stop  Change symbicort to 160 Take 2 puffs first thing in am and then another 2 puffs about 12 hours later.  - Spirometry 11/13/2017  FEV1 1.79 (78%)  Ratio 81 with slight curvature p am symb 80 x2    - Allergy profile 11/13/2017 >  Eos  0.5 /  IgE  81 RAST neg  - sinus CT 11/19/2017 > neg  - Singulair started 11/24/17    12/25/2017  f/u ov/Wert re: uacs vs asthma  Off acei since around the 1st of 2019 / did not bring meds as req  Chief Complaint  Patient presents with  . Follow-up    6 week f/u , feeling better, singular helping, still having a cough but not as bad , mornings are hard but less SOB during the day  Dyspnea:  Really not limited by doe  Cough:after wakes about an hour feels need to "get it out" min white mucus prod   Sleep: ok now  SABA use:  Rare despite "wheeze" that doesn't get better with saba  Typically after supper feels like she's wheezing right up until bedtime  rec Plan A = Automatic = symbicort 80 Take 2 puffs first thing in am and then another 2 puffs about 12 hours later.  Change the pepcid to where you take it after supper  GERD    Plan B = Backup Only use your albuterol as a rescue medication  Plan C = Crisis -  If still not doing better > Prednisone 10 mg take  4 each am x 2 days,   2 each am x 2 days,  1 each am x 2 days and stop   Please schedule a follow up office visit in 2 weeks, sooner if needed  with all medications /inhalers/ solutions in hand so we can verify exactly what you are taking. This includes all medications from all doctors and over the counters    01/11/2018  f/u ov/Wert re: not completely  free of cough  Since 2016 but pattern has changed  Chief Complaint  Patient presents with  . Follow-up    Breathing is doing better and she has not needed to use her albuterol inhaler. Her cough has been prod with yellow sputum x 3-4 days.   Dyspnea:  Not limited by breathing from desired activities   Cough: day> noct  and not p supper - has changed Sleep: fine SABA use:  none Last dose of prednisone 01/11/2018 helped some/ confused between maint rx and prns   No obvious day to day or daytime variability or assoc excess/ purulent sputum or mucus plugs or hemoptysis or cp or chest  tightness, subjective wheeze or overt sinus or hb symptoms. No unusual exposure hx or h/o childhood pna/ asthma or knowledge of premature birth.  Sleeping  Fine now   without nocturnal  or early am exacerbation  of respiratory  c/o's or need for noct saba. Also denies any obvious fluctuation of symptoms with weather or environmental changes or other aggravating or alleviating factors except as outlined above   Current Allergies, Complete Past Medical History, Past Surgical History, Family History, and Social History were reviewed in [employer] record.  ROS  The following are not active complaints unless bolded Hoarseness, sore throat, dysphagia, dental problems, itching, sneezing,  nasal congestion or discharge of excess mucus or purulent secretions, ear ache,   fever, chills, sweats, unintended wt loss or wt gain, classically pleuritic or exertional cp,  orthopnea pnd or arm/hand swelling  or leg swelling, presyncope, palpitations, abdominal pain, anorexia, nausea, vomiting, diarrhea  or change in bowel habits or change in bladder habits, change in stools or change in urine, dysuria, hematuria,  rash, arthralgias, visual complaints, headache, numbness, weakness or ataxia or problems with walking or coordination,  change in mood or  memory.        Current Meds  Medication Sig  . albuterol (PROVENTIL HFA;VENTOLIN HFA) 108 (90 Base) MCG/ACT inhaler Inhale 2 puffs into the lungs every 6 (six) hours as needed for wheezing or shortness of breath.  . benzonatate (TESSALON) 200 MG capsule Take 1 capsule (200 mg total) by mouth 3 (three) times daily as needed for cough.  . budesonide-formoterol (SYMBICORT) 80-4.5 MCG/ACT inhaler Take 2 puffs first thing in am and then another 2 puffs about 12 hours later.  . cetirizine (ZYRTEC) 10 MG tablet Take 10 mg by mouth daily as needed.   . cyclobenzaprine (FLEXERIL) 10 MG tablet Take 10 mg by mouth 3 (three) times daily as needed for muscle  spasms.  . famotidine (PEPCID) 20 MG tablet One at bedtime  . ibuprofen (ADVIL,MOTRIN) 800 MG tablet Take 800 mg by mouth every 8 (eight) hours as needed for moderate pain.  . montelukast (SINGULAIR)  10 MG tablet Take 1 tablet (10 mg total) by mouth at bedtime.  . Multiple Vitamin (MULTIVITAMIN WITH MINERALS) TABS tablet Take 1 tablet by mouth daily.  Marland Kitchen. omeprazole (PRILOSEC) 40 MG capsule Take 1 capsule (40 mg total) by mouth daily.  . potassium chloride (K-DUR) 10 MEQ tablet Take 10 mEq by mouth daily.  Marland Kitchen. spironolactone (ALDACTONE) 50 MG tablet Take 50 mg by mouth daily.  . [DISCONTINUED] budesonide-formoterol (SYMBICORT) 80-4.5 MCG/ACT inhaler Inhale 2 puffs into the lungs 2 (two) times daily.  . [DISCONTINUED] furosemide (LASIX) 40 MG tablet Take 40 mg by mouth daily.  .     .                             Physical Exam:  amb bf nad/ no longer any pseudowheeze at all    01/11/2018        195  12/25/2017        191  11/13/2017        193   10/16/17 193 lb 3.2 oz (87.6 kg)  09/30/17 194 lb 6.4 oz (88.2 kg)  09/10/17 196 lb 6.9 oz (89.1 kg)       Vital signs reviewed - Note on arrival 02 sats  98% on RA      HEENT: nl dentition, , and oropharynx. Nl external ear canals without cough reflex - moderate bilateral non-specific turbinate edema     NECK :  without JVD/Nodes/TM/ nl carotid upstrokes bilaterally   LUNGS: no acc muscle use,  Nl contour chest which is clear to A and P bilaterally without cough on insp or exp maneuvers   CV:  RRR  no s3 or murmur or increase in P2, and no edema   ABD:  soft and nontender with nl inspiratory excursion in the supine position. No bruits or organomegaly appreciated, bowel sounds nl  MS:  Nl gait/ ext warm without deformities, calf tenderness, cyanosis or clubbing No obvious joint restrictions   SKIN: warm and dry without lesions    NEURO:  alert, approp, nl sensorium with  no motor or cerebellar deficits apparent.                       Assessment/Plan

## 2018-01-11 NOTE — Patient Instructions (Addendum)
zpak    Stop losartan and start bisoprol 5 mg daily     See Tammy NP in with all your medications, even over the counter meds, separated in two separate bags, the ones you take no matter what vs the ones you stop once you feel better and take only as needed when you feel you need them.   Tammy  will generate for you a new user friendly medication calendar that will put us all on the same page re: your medication use.     Without this process, it simply isn't possible to assure that we are providing  your outpatient care  with  the attention to detail we feel you deserve.   If we cannot assure that you're getting that kind of care,  then we cannot manage your problem effectively from this clinic.  Once you have seen Tammy and we are sure that we're all on the same page with your medication use she will arrange follow up with me.  Late add:  Allergy profile/ cbc with diff next ov if no recent pred exposure

## 2018-01-12 ENCOUNTER — Encounter: Payer: Self-pay | Admitting: Internal Medicine

## 2018-01-12 NOTE — Assessment & Plan Note (Addendum)
Off ACEi ? 09/2017  - PFT's 10/16/2017  wnl on BREO but still coughing  - 10/16/2017    changed breo to symbicort 80 2bid due to cough  - 11/13/2017  After extensive coaching inhaler device  effectiveness =    90% try increase symbicort 160 2bid due to noct cough on a trial basis - Spirometry 11/13/2017  FEV1 1.79 (78%)  Ratio 81 with slight curvature p am symb 80 x2    - Allergy profile 11/13/2017 >  Eos 0.5 /  IgE  81 RAST neg  - sinus CT 11/19/2017 > neg  - Singulair started 11/24/17 - Spirometry 12/25/2017 wnl on symb 160 2bid with audible wheeze, no flattening of f/v  - FENO 12/25/2017  =   97 on singulair/ symb 80 2 bid with poor hfa - 01/11/2018  After extensive coaching inhaler device  effectiveness =    75% from baseline 25%  She improves transiently on prednisone but not sustaining on symb 80 and may need to move up to 160 based on FENO but she hasn't mastered hfa yet and seems confused as to maint vs prns and reasons for prns so needs to return first for full med reconciliation and do allergy profile off pred > 1 week to see   true EOS level     To keep things simple, I have asked the patient to first separate medicines that are perceived as maintenance, that is to be taken daily "no matter what", from those medicines that are taken on only on an as-needed basis and I have given the patient examples of both, and then return to see our NP to generate a  detailed  medication calendar which should be followed until the next physician sees the patient and updates it.     I had an extended discussion with the patient reviewing all relevant studies completed to date and  lasting 15 to 20 minutes of a 25 minute visit    Each maintenance medication was reviewed in detail including most importantly the difference between maintenance and prns and under what circumstances the prns are to be triggered using an action plan format that is not reflected in the computer generated alphabetically organized AVS.     Please see AVS for specific instructions unique to this visit that I personally wrote and verbalized to the the pt in detail and then reviewed with pt  by my nurse highlighting any  changes in therapy recommended at today's visit to their plan of care.

## 2018-01-12 NOTE — Assessment & Plan Note (Signed)
Echo 09/09/17  - LVEF 65-70%, mild LVH, normal wall motion, grade 1 DD,   indeterminate LV filling pressure, mild LAE, trivial TR, RVSP 39   mmHg, dilated IVC suggestive of mild pulmonary hypertension. - changed ARB to Betablocker 01/11/2018 due to persistent uacs and diastolic dysfunction   For reasons that may related to vascular permability and nitric oxide pathways but not elevated  bradykinin levels (as seen with  ACEi use which she clearly should not be taking given her hx) losartan in the generic form has been reported now from mulitple sources  to cause a similar pattern of non-specific  upper airway symptoms as seen with acei.   This has not been reported with exposure to the other ARB's to date, so it seems reasonable for now to try either generic diovan or avapro if ARB needed or use an alternative class altogether.   Favor trial of BB = bisoprolol 5 mg daily  See:  Ann [facility]ergy Asthma Immunol  2008: 101: p [phone]

## 2018-01-17 ENCOUNTER — Other Ambulatory Visit: Payer: Self-pay | Admitting: Acute Care

## 2018-02-02 ENCOUNTER — Encounter: Payer: Self-pay | Admitting: Adult Health

## 2018-02-02 ENCOUNTER — Ambulatory Visit (INDEPENDENT_AMBULATORY_CARE_PROVIDER_SITE_OTHER): Payer: BLUE CROSS/BLUE SHIELD | Admitting: Adult Health

## 2018-02-02 DIAGNOSIS — J45991 Cough variant asthma: Secondary | ICD-10-CM | POA: Diagnosis not present

## 2018-02-02 DIAGNOSIS — I10 Essential (primary) hypertension: Secondary | ICD-10-CM | POA: Diagnosis not present

## 2018-02-02 NOTE — Patient Instructions (Addendum)
Continue on current regimen  Follow med calendar closely and bring to each visit.  Follow up with Primary MD for blood pressure management  Follow up with Dr. Sherene Sires  In 3 months and As needed

## 2018-02-02 NOTE — Progress Notes (Signed)
  ID: Jillian Jefferson, female    DOB: [birth year], 57 y.o.   MRN: [id number]  Chief Complaint  Patient presents with  . Follow-up    Cough     Referring provider: Thayer Headings, MD  HPI: 57 year old female with minimal smoking history as a teenager followed for asthma and upper airway cough syndrome (?ACE inhibitor stopped 09/2017 )   TEST Nadeen Landau   Spirometry 11/13/2017  FEV1 1.79 (78%)  Ratio 81 with slight curvature p am symb 80 x2    - Allergy profile 11/13/2017 >  Eos 0.5 /  IgE  81 RAST neg  - sinus CT 11/19/2017 > neg  - Singulair started 11/24/17 - Spirometry 12/25/2017 wnl on symb 160 2bid with audible wheeze, no flattening of f/v - FENO 12/25/2017  =   97 on singulair/ symb 80 2 bid with poor hfa   02/02/2018 Follow up; Asthma/Cough  Patient returns for a one-month follow-up.  Patient has been following for a difficult control cough and asthma over the last few months.  Spirometry  was essentially normal.  CT sinus was negative.  Allergy profile showed IgE at 81.  Patient's exhaled nitric oxide testing was elevated at 97.  She has been treated on Symbicort and Singulair.  Initially patient was seen with ongoing cough and ACE inhibitor was stopped in January 2019. Changed from Losartan to bisoprolol . There was a hold up at pharmacy and she changed to bisoprolol yesterday .  Patient says she is feeling much better , cough is decreased. Has an occasional wheezing but is improved.   B/p well controlled on current regimen   We reviewed all her medications and organize them into a medication calendar with patient education.  It appears that she is taking her medications correctly     No Known Allergies  Immunization History  Administered Date(s) Administered  . Influenza Split 07/16/2017    Past Medical History:  Diagnosis Date  . Hypertension     Tobacco History: Social History   Tobacco Use  Smoking Status Former Smoker  . Packs/day: 0.30  . Years: 3.00  .  Pack years: 0.90  . Last attempt to quit: 1981  . Years since quitting: 38.3  Smokeless Tobacco Never Used   Counseling given: Not Answered   Outpatient Encounter Medications as of 02/02/2018  Medication Sig  . albuterol (PROVENTIL HFA;VENTOLIN HFA) 108 (90 Base) MCG/ACT inhaler Inhale 2 puffs into the lungs every 6 (six) hours as needed for wheezing or shortness of breath.  . benzonatate (TESSALON) 200 MG capsule Take 1 capsule (200 mg total) by mouth 3 (three) times daily as needed for cough.  . bisoprolol (ZEBETA) 5 MG tablet Take 1 tablet (5 mg total) by mouth daily.  . budesonide-formoterol (SYMBICORT) 80-4.5 MCG/ACT inhaler Take 2 puffs first thing in am and then another 2 puffs about 12 hours later.  . cetirizine (ZYRTEC) 10 MG tablet Take 10 mg by mouth daily as needed.   . cyclobenzaprine (FLEXERIL) 10 MG tablet Take 10 mg by mouth 3 (three) times daily as needed for muscle spasms.  . famotidine (PEPCID) 20 MG tablet One at bedtime  . ibuprofen (ADVIL,MOTRIN) 800 MG tablet Take 800 mg by mouth every 8 (eight) hours as needed for moderate pain.  . montelukast (SINGULAIR) 10 MG tablet Take 1 tablet (10 mg total) by mouth at bedtime.  . Multiple Vitamin (MULTIVITAMIN WITH MINERALS) TABS tablet Take 1 tablet by mouth daily.  Marland Kitchen omeprazole (PRILOSEC) 40 MG  capsule TAKE 1 CAPSULE BY MOUTH EVERY DAY  . potassium chloride (K-DUR) 10 MEQ tablet Take 10 mEq by mouth daily.  Marland Kitchen spironolactone (ALDACTONE) 50 MG tablet Take 50 mg by mouth daily.  . furosemide (LASIX) 40 MG tablet Take 1 tablet (40 mg total) by mouth daily.  . [DISCONTINUED] azithromycin (ZITHROMAX) 250 MG tablet Take 2 on day one then 1 daily x 4 days (Patient not taking: Reported on 02/02/2018)   No facility-administered encounter medications on file as of 02/02/2018.      Review of Systems  Constitutional:   No  weight loss, night sweats,  Fevers, chills +, fatigue, or  lassitude.  HEENT:   No headaches,  Difficulty  swallowing,  Tooth/dental problems, or  Sore throat,                No sneezing, itching, ear ache, nasal congestion, post nasal drip,   CV:  No chest pain,  Orthopnea, PND, swelling in lower extremities, anasarca, dizziness, palpitations, syncope.   GI  No heartburn, indigestion, abdominal pain, nausea, vomiting, diarrhea, change in bowel habits, loss of appetite, bloody stools.   Resp: No shortness of breath with exertion or at rest.  No excess mucus, no productive cough,  No non-productive cough,  No coughing up of blood.  No change in color of mucus.  No wheezing.  No chest wall deformity  Skin: no rash or lesions.  GU: no dysuria, change in color of urine, no urgency or frequency.  No flank pain, no hematuria   MS:  No joint pain or swelling.  No decreased range of motion.  No back pain.    Physical Exam  BP 106/72 (BP Location: Left Arm, Cuff Size: Normal)   Pulse 92   Ht  (1.651 m)   Wt 197 lb (89.4 kg)   SpO2 99%   BMI 32.78 kg/m   GEN: A/Ox3; pleasant , NAD, well nourished    HEENT:  [place]/AT,  EACs-clear, TMs-wnl, NOSE-clear, THROAT-clear, no lesions, no postnasal drip or exudate noted.   NECK:  Supple w/ fair ROM; no JVD; normal carotid impulses w/o bruits; no thyromegaly or nodules palpated; no lymphadenopathy.    RESP  Clear  P & A; w/o, wheezes/ rales/ or rhonchi. no accessory muscle use, no dullness to percussion  CARD:  RRR, no m/r/g, no peripheral edema, pulses intact, no cyanosis or clubbing.  GI:   Soft & nt; nml bowel sounds; no organomegaly or masses detected.   Musco: Warm bil, no deformities or joint swelling noted.   Neuro: alert, no focal deficits noted.    Skin: Warm, no lesions or rashes    Lab Results:  CBC    Component Value Date/Time   WBC 7.0 11/13/2017 1141   RBC 4.00 11/13/2017 1141   HGB 11.6 (L) 11/13/2017 1141   HCT 35.6 (L) 11/13/2017 1141   PLT 341.0 11/13/2017 1141   MCV 89.1 11/13/2017 1141   MCH 28.7 09/10/2017 0313     MCHC 32.7 11/13/2017 1141   RDW 13.6 11/13/2017 1141   LYMPHSABS 1.8 11/13/2017 1141   MONOABS 0.5 11/13/2017 1141   EOSABS 0.5 11/13/2017 1141   BASOSABS 0.1 11/13/2017 1141    BMET    Component Value Date/Time   NA 136 09/10/2017 0313   K 3.6 09/10/2017 0313   CL 97 (L) 09/10/2017 0313   CO2 30 09/10/2017 0313   GLUCOSE 110 (H) 09/10/2017 0313   BUN 23 (H) 09/10/2017 1610  CREATININE 0.81 09/10/2017 0313   CALCIUM 8.9 09/10/2017 0313   GFRNONAA >60 09/10/2017 0313   GFRAA >60 09/10/2017 0313    BNP    Component Value Date/Time   BNP 60.0 09/08/2017 0508    ProBNP No results found for: PROBNP  Imaging: No results found.   Assessment & Plan:   Cough variant asthma  vs UACS  Asthma with good control on current regimen with Symbicort Singulair and Zyrtec. Continue on prevention control of allergic rhinitis and GERD Cough is improved off of ACE inhibitor.  There was some concern of ongoing cough with losartan this has been changed.  Advised patient to follow with her primary care physician for ongoing pressure management.  She was doing well on bisoprolol today.  Patient's medications were reviewed today and patient education was given. Computerized medication calendar was adjusted/completed    Plan  Patient Instructions  Continue on current regimen  Follow med calendar closely and bring to each visit.  Follow up with Primary MD for blood pressure management  Follow up with Dr. Sherene Sires  In 3 months and As needed       Essential hypertension Well-controlled on current regimen.  She is follow with primary care physician for ongoing blood pressure management.  ACE inhibitor was discontinued in January due to upper airway cough.  She was changed from losartan to bisoprolol due to upper airway cough and ongoing cough issues with asthma.     Rubye Oaks, NP 02/02/2018

## 2018-02-02 NOTE — Assessment & Plan Note (Addendum)
Asthma with good control on current regimen with Symbicort Singulair and Zyrtec. Continue on prevention control of allergic rhinitis and GERD Cough is improved off of ACE inhibitor.  There was some concern of ongoing cough with losartan this has been changed.  Advised patient to follow with her primary care physician for ongoing pressure management.  She was doing well on bisoprolol today.  Patient's medications were reviewed today and patient education was given. Computerized medication calendar was adjusted/completed    Plan  Patient Instructions  Continue on current regimen  Follow med calendar closely and bring to each visit.  Follow up with Primary MD for blood pressure management  Follow up with Dr. Sherene Sires  In 3 months and As needed

## 2018-02-02 NOTE — Assessment & Plan Note (Signed)
Well-controlled on current regimen.  She is follow with primary care physician for ongoing blood pressure management.  ACE inhibitor was discontinued in January due to upper airway cough.  She was changed from losartan to bisoprolol due to upper airway cough and ongoing cough issues with asthma.

## 2018-02-02 NOTE — Progress Notes (Signed)
Chart and office note reviewed in detail  > agree with a/p as outlined    

## 2018-02-09 NOTE — Addendum Note (Signed)
Addended by: Etheleen Mayhew C on: 02/09/2018 03:51 PM   Modules accepted: Orders

## 2018-05-05 ENCOUNTER — Ambulatory Visit (INDEPENDENT_AMBULATORY_CARE_PROVIDER_SITE_OTHER): Payer: No Typology Code available for payment source | Admitting: Internal Medicine

## 2018-05-05 ENCOUNTER — Encounter: Payer: Self-pay | Admitting: Internal Medicine

## 2018-05-05 ENCOUNTER — Other Ambulatory Visit (INDEPENDENT_AMBULATORY_CARE_PROVIDER_SITE_OTHER): Payer: No Typology Code available for payment source

## 2018-05-05 VITALS — BP 140/90 | HR 65 | Ht 65.0 in | Wt 202.6 lb

## 2018-05-05 DIAGNOSIS — J45991 Cough variant asthma: Secondary | ICD-10-CM | POA: Diagnosis not present

## 2018-05-05 DIAGNOSIS — I10 Essential (primary) hypertension: Secondary | ICD-10-CM

## 2018-05-05 LAB — BASIC METABOLIC PANEL
BUN: 18 mg/dL (ref 6–23)
CALCIUM: 10 mg/dL (ref 8.4–10.5)
CO2: 32 mEq/L (ref 19–32)
CREATININE: 0.84 mg/dL (ref 0.40–1.20)
Chloride: 102 mEq/L (ref 96–112)
GFR: 90.34 mL/min (ref >60.00)
GLUCOSE: 94 mg/dL (ref 70–99)
Potassium: 3.9 mEq/L (ref 3.5–5.1)
Sodium: 139 mEq/L (ref 135–145)

## 2018-05-05 LAB — NITRIC OXIDE: Nitric Oxide: 38

## 2018-05-05 MED ORDER — BUDESONIDE-FORMOTEROL FUMARATE 160-4.5 MCG/ACT IN AERO: INHALATION_SPRAY | RESPIRATORY_TRACT | 12 refills | Status: DC

## 2018-05-05 NOTE — Assessment & Plan Note (Signed)
Off ACEi ? 09/2017  - PFT's 10/16/2017  wnl on BREO but still coughing  - 10/16/2017    changed breo to symbicort 80 2bid due to cough  - 11/13/2017  After extensive coaching inhaler device  effectiveness =    90% try increase symbicort 160 2bid due to noct cough on a trial basis - Spirometry 11/13/2017  FEV1 1.79 (78%)  Ratio 81 with slight curvature p am symb 80 x2    - Allergy profile 11/13/2017 >  Eos 0.5 /  IgE  81 RAST neg  - sinus CT 11/19/2017 > neg  - Singulair started 11/24/17 - Spirometry 12/25/2017 wnl on symb 160 2bid with audible wheeze, no flattening of f/v - FENO 12/25/2017  =   97 on singulair/ symb 80 2 bid with poor hfa   - FENO 05/05/2018  =   38 - 05/05/2018  After extensive coaching inhaler device  effectiveness =   75% so rec increase symb 160 2bid    All goals of chronic asthma control met including optimal function and elimination of symptoms with minimal need for rescue therapy but feno still high so reasonable to try max symb dose plus continue singulair   Contingencies discussed in full including contacting this office immediately if not controlling the symptoms using the rule of two's.     I had an extended discussion with the patient reviewing all relevant studies completed to date and  lasting 15 to 20 minutes of a 25 minute visit    See device teaching which extended face to face time for this visit.  Each maintenance medication was reviewed in detail including emphasizing most importantly the difference between maintenance and prns and under what circumstances the prns are to be triggered using an action plan format that is not reflected in the computer generated alphabetically organized AVS which I have not found useful in most complex patients, especially with respiratory illnesses  Please see AVS for specific instructions unique to this visit that I personally wrote and verbalized to the the pt in detail and then reviewed with pt  by my nurse highlighting any  changes  in therapy recommended at today's visit to their plan of care.

## 2018-05-05 NOTE — Assessment & Plan Note (Signed)
Echo 09/09/17  - LVEF 65-70%, mild LVH, normal wall motion, grade 1 DD,   indeterminate LV filling pressure, mild LAE, trivial TR, RVSP 39   mmHg, dilated IVC suggestive of mild pulmonary hypertension. - changed ARB to Betablocker 01/11/2018 due to persistent uacs and diastolic dysfunction   Not ideally  controlled on present regimen. I reviewed this with the patient and emphasized importance of follow-up with primary care.  Will check bmet > Follow up per Primary Care planned

## 2018-05-05 NOTE — Patient Instructions (Addendum)
Work on inhaler technique:  relax and gently blow all the way out then take a nice smooth deep breath back in, triggering the inhaler at same time you start breathing in.  Hold for up to 5 seconds if you can. Blow out thru nose. Rinse and gargle with water when done   Please remember to go to the lab department downstairs in the basement  for your tests - we will call you with the results when they are available.       See calendar for specific medication instructions and bring it back for each and every office visit for every healthcare provider you see.  Without it,  you may not receive the best quality medical care that we feel you deserve.  You will note that the calendar groups together  your maintenance  medications that are timed at particular times of the day.  Think of this as your checklist for what your doctor has instructed you to do until your next evaluation to see what benefit  there is  to staying on a consistent group of medications intended to keep you well.  The other group at the bottom is entirely up to you to use as you see fit  for specific symptoms that may arise between visits that require you to treat them on an as needed basis.  Think of this as your action plan or "what if" list.   Separating the top medications from the bottom group is fundamental to providing you adequate care going forward.     Please schedule a follow up visit in 3 months but call sooner if needed

## 2018-05-05 NOTE — Progress Notes (Signed)
History of Present Illness  [id number] yobf  history of HTN on  Chronic ACEi and family history of asthma smoked only as teenager with new pattern of winter time tendency for colds to go down to chest x age 57 [place] moved to [place] around 2015 pattern had continued while on ACEi leading up to admit:   Admit date: 09/07/2017 Discharge date: 09/10/2017    Brief/Interim Summary: The patient is a 57 year old female with history of hypertension and family history of asthma who presented with a 2-week history of cough, wheezing, and shortness of breath. She had gone to her primary care doctor who prescribed her antibiotics but she did not improve so she presented to the emergency department. Chest x-ray was unremarkable. She was wheezing and started on steroids and nebulizer treatments. D-dimer was elevated however her CT angiogram chest was negative for pulmonary embolism. She was treated for acute bronchitis/asthma at the direction of pulmonary/critical care.   Additionally, she complained of some swelling of her lower extremities and some abdominal fullness.  She was also given some diuretics due to concerns for acute diastolic heart failure.  Her echocardiogram demonstrated grade 1 diastolic dysfunction with a dilated IVC and decreased compressibility of the IVC suggestive of elevated CVP.  At the time of discharge, she was able to ambulate in the halls maintaining oxygen saturations 97% and higher without significant dyspnea.  Discharge Diagnoses:  Principal Problem:   Acute bronchitis Active Problems:   Hypokalemia   Acute respiratory failure with hypoxia (HCC)   Essential hypertension  Acute respiratory failure with hypoxia likely secondary to new diagnosis of asthma, exacerbation triggered by acute bronchitis. She has a strong family history of asthma although she herself is never had wheezing. Respiratory viral panel was negative.  -Continue azithromycin to  complete course for pneumonia/bronchitis -  Prednisone burst -  Continue home glycopyrrolate-formoterol and prn albuterol  - Follow-up with pulmonology as an outpatient to schedule pulmonary function tests -  If cough and wheeze continue despite steroids and antibiotics, consider discontinuing her lisinopril  Nocturnal desaturations, intermittent down to the mid 80s usually but she had a few episodes where she would transiently go down to the high 70s with good waveform. -Recommend outpatient sleep study  Hypokalemia, likely secondary to patient's hydrochlorothiazide, improved with potassium supplementation  Hypertension, uncontrolled. Patient did not want to take amlodipine because she felt like her symptoms worsened. She wants to continue lisinopril. Continue IV hydralazine as needed.  Hydrochlorothiazide changed to Lasix for better diuresis.  Anticipate that her blood pressure may start to trend down after she stops her steroids.  Will defer further management to her PCP.  Acute diastolic heart failure -Echocardiogram:Grade 1 diastolic dysfunction, suggestion of elevated left ventricular filling pressures. IVC was dilated and blunted suggestive of elevated CVP. Also have some mildly elevated pulmonary arterial pressures consistent with mild pulmonary arterial hypertension. -Weight is 89.1 kg on the date of discharge -Stopped her HCTZ and started her on Lasix 40 mg once daily      NP eval 09/30/17 post hosp: FENO = 80 Prednisone taper; 10 mg tablets: 4 tabs x 2 days, 3 tabs x 2 days, 2 tabs x 2 days 1 tab x 2 days then stop. Please schecdule for PFT's  ACE level today Schedule for sleep study Omeprazole 40 mg once daily Sips of water instead of throat clearing Sugar free hard handy for throat soothing ( [employer]) GERD Diet Avoid peppermint, Spearmint, Menthol,Chocolate  Delsym 5 cc's every 12 hours Continue Breo 1 puff once daily Rinse mouth after use Tessalon Perles  100 mg three times daily as needed for cough  Do not drive if sleepy.      10/16/2017 ov feeling better with losartan x 2 weeks in place of lisinopril but still some sensation of choking esp at hs and not taking ppi as rec.  Cough is dry/ raspy also worse at hs Doe  = MMRC2 = can't walk a nl pace on a flat grade s sob but does fine slow and flat  rec Plan A = Automatic = symbicort 80 Take 2 puffs first thing in am and then another 2 puffs about 12 hours later.  Prilosec 40 mg Take 30-60 min before first meal of the day and add pepcid 20 mg at bedtime until no cough at all for a week then stop it  work on inhaler technique:     Plan B = Backup Only use your albuterol (ventolin)as a rescue medication  GERD diet  Please schedule a follow up office visit in 4 weeks, sooner if needed  with all medications /inhalers/ solutions in hand so we can verify exactly what you are taking. This includes all medications from all doctors and over the counters     11/13/2017  f/u ov/Makinzee Durley re: chronic cough recurrent x 2016/ tends to flair in winter / did not bring meds as req Chief Complaint  Patient presents with  . Follow-up    She states she started coughing last night after being exposed to cold weather. She is unsure of sputum color. She states that she had an episode 2 wks ago where she woke up wheezing and had chest tightness- albuterol helped some. She is using her albuterol 1-2 x per wk on average.   Dyspnea:   MMRC1 = can walk nl pace, flat grade, can't hurry or go uphills or steps s sob   Cough: sporadic but  def worse when exp to cold air  Sleep: cough esp 3am  Most am's  rec Prednisone 10 mg take  4 each am x 2 days,   2 each am x 2 days,  1 each am x 2 days and stop  Change symbicort to 160 Take 2 puffs first thing in am and then another 2 puffs about 12 hours later.  - Spirometry 11/13/2017  FEV1 1.79 (78%)  Ratio 81 with slight curvature p am symb 80 x2    - Allergy profile 11/13/2017 >  Eos  0.5 /  IgE  81 RAST neg  - sinus CT 11/19/2017 > neg  - Singulair started 11/24/17    12/25/2017  f/u ov/Jennings Stirling re: uacs vs asthma  Off acei since around the 1st of 2019 / did not bring meds as req  Chief Complaint  Patient presents with  . Follow-up    6 week f/u , feeling better, singular helping, still having a cough but not as bad , mornings are hard but less SOB during the day  Dyspnea:  Really not limited by doe  Cough:after wakes about an hour feels need to "get it out" min white mucus prod   Sleep: ok now  SABA use:  Rare despite "wheeze" that doesn't get better with saba  Typically after supper feels like she's wheezing right up until bedtime  rec Plan A = Automatic = symbicort 80 Take 2 puffs first thing in am and then another 2 puffs about 12 hours later.  Change the pepcid to where you take it after supper  GERD    Plan B = Backup Only use your albuterol as a rescue medication  Plan C = Crisis -  If still not doing better > Prednisone 10 mg take  4 each am x 2 days,   2 each am x 2 days,  1 each am x 2 days and stop   Please schedule a follow up office visit in 2 weeks, sooner if needed  with all medications /inhalers/ solutions in hand so we can verify exactly what you are taking. This includes all medications from all doctors and over the counters    01/11/2018  f/u ov/Earlee Herald re: not completely  free of cough  Since 2016 but pattern has changed  Chief Complaint  Patient presents with  . Follow-up    Breathing is doing better and she has not needed to use her albuterol inhaler. Her cough has been prod with yellow sputum x 3-4 days.   Dyspnea:  Not limited by breathing from desired activities   Cough: day> noct  and not p supper - has changed Sleep: fine SABA use:  none Last dose of prednisone 01/11/2018 helped some/ confused between maint rx and prns  rec Stop losartan and start bisoprol 5 mg daily  See Tammy NP in with all your medications, even over the counter meds,  separated in two separate bags, the ones you take no matter what vs the ones you stop once you feel better and take only as needed when you feel you need them      05/05/2018  f/u ov/Quinn Quam re: cough variant asthma/ no med calendar  Chief Complaint  Patient presents with  . Follow-up    Breathing has improved since the last visit. She has occ wheezing. She has not had to use her albuterol inhaler.   Dyspnea:  Not limited by breathing from desired activities   Cough:  none   SABA use: non 02: none   Subjective wheezing very little no  pattern but never wakes her up     No obvious day to day or daytime variability or assoc excess/ purulent sputum or mucus plugs or hemoptysis or cp or chest tightness,  or overt sinus or hb symptoms.   Sleeping: flat/ 1-2 pillows without nocturnal  or early am exacerbation  of respiratory  c/o's or need for noct saba. Also denies any obvious fluctuation of symptoms with weather or environmental changes or other aggravating or alleviating factors except as outlined above   No unusual exposure hx or h/o childhood pna/ asthma or knowledge of premature birth.  Current Allergies, Complete Past Medical History, Past Surgical History, Family History, and Social History were reviewed in [employer] record.  ROS  The following are not active complaints unless bolded Hoarseness, sore throat, dysphagia, dental problems, itching, sneezing,  nasal congestion or discharge of excess mucus or purulent secretions, ear ache,   fever, chills, sweats, unintended wt loss or wt gain, classically pleuritic or exertional cp,  orthopnea pnd or arm/hand swelling  or leg swelling, presyncope, palpitations, abdominal pain, anorexia, nausea, vomiting, diarrhea  or change in bowel habits or change in bladder habits, change in stools or change in urine, dysuria, hematuria,  rash, arthralgias, visual complaints, headache, numbness, weakness or ataxia or problems with  walking or coordination,  change in mood or  memory.        Current Meds  Medication Sig  . albuterol (PROVENTIL  HFA;VENTOLIN HFA) 108 (90 Base) MCG/ACT inhaler Inhale 2 puffs into the lungs every 6 (six) hours as needed for wheezing or shortness of breath.  . benzonatate (TESSALON) 200 MG capsule Take 1 capsule (200 mg total) by mouth 3 (three) times daily as needed for cough.  . bisoprolol (ZEBETA) 5 MG tablet Take 1 tablet (5 mg total) by mouth daily.  . budesonide-formoterol (SYMBICORT) 80-4.5 MCG/ACT inhaler Take 2 puffs first thing in am and then another 2 puffs about 12 hours later.  . cetirizine (ZYRTEC) 10 MG tablet Take 10 mg by mouth daily as needed.   . cyclobenzaprine (FLEXERIL) 10 MG tablet Take 10 mg by mouth 3 (three) times daily as needed for muscle spasms.  . famotidine (PEPCID) 20 MG tablet One at bedtime  . ibuprofen (ADVIL,MOTRIN) 800 MG tablet Take 800 mg by mouth every 8 (eight) hours as needed for moderate pain.  . montelukast (SINGULAIR) 10 MG tablet Take 1 tablet (10 mg total) by mouth at bedtime.  Marland Kitchen. omeprazole (PRILOSEC) 40 MG capsule TAKE 1 CAPSULE BY MOUTH EVERY DAY  . potassium chloride (K-DUR) 10 MEQ tablet Take 10 mEq by mouth daily.  Marland Kitchen. spironolactone (ALDACTONE) 50 MG tablet Take 50 mg by mouth daily.              Physical Exam:  amb bf nad   05/05/2018          202  01/11/2018        195  12/25/2017        191  11/13/2017        193   10/16/17 193 lb 3.2 oz (87.6 kg)  09/30/17 194 lb 6.4 oz (88.2 kg)  09/10/17 196 lb 6.9 oz (89.1 kg)      Vital signs reviewed - Note on arrival 02 sats  100% on RA and bp 140/90       HEENT: nl dentition, turbinates bilaterally, and oropharynx. Nl external ear canals without cough reflex   NECK :  without JVD/Nodes/TM/ nl carotid upstrokes bilaterally   LUNGS: no acc muscle use,  Nl contour chest which is clear to A and P bilaterally without cough on insp or exp maneuvers   CV:  RRR  no s3 or murmur or  increase in P2, and no edema   ABD:  soft and nontender with nl inspiratory excursion in the supine position. No bruits or organomegaly appreciated, bowel sounds nl  MS:  Nl gait/ ext warm without deformities, calf tenderness, cyanosis or clubbing No obvious joint restrictions   SKIN: warm and dry without lesions    NEURO:  alert, approp, nl sensorium with  no motor or cerebellar deficits apparent.                     Assessment/Plan

## 2018-05-06 NOTE — Progress Notes (Signed)
Spoke with pt and notified of results per Dr. Wert. Pt verbalized understanding and denied any questions. 

## 2018-07-19 ENCOUNTER — Other Ambulatory Visit: Payer: Self-pay | Admitting: Internal Medicine

## 2018-07-30 ENCOUNTER — Other Ambulatory Visit: Payer: Self-pay | Admitting: Internal Medicine

## 2018-08-05 ENCOUNTER — Encounter: Payer: Self-pay | Admitting: Internal Medicine

## 2018-08-05 ENCOUNTER — Ambulatory Visit (INDEPENDENT_AMBULATORY_CARE_PROVIDER_SITE_OTHER): Payer: No Typology Code available for payment source | Admitting: Internal Medicine

## 2018-08-05 DIAGNOSIS — I10 Essential (primary) hypertension: Secondary | ICD-10-CM

## 2018-08-05 DIAGNOSIS — J45991 Cough variant asthma: Secondary | ICD-10-CM

## 2018-08-05 MED ORDER — BISOPROLOL FUMARATE 5 MG PO TABS: ORAL_TABLET | ORAL | 11 refills | Status: DC

## 2018-08-05 NOTE — Assessment & Plan Note (Signed)
Off ACEi ? 09/2017  - PFT's 10/16/2017  wnl on BREO but still coughing  - 10/16/2017    changed breo to symbicort 80 2bid due to cough  - 11/13/2017  After extensive coaching inhaler device  effectiveness =    90% try increase symbicort 160 2bid due to noct cough on a trial basis - Spirometry 11/13/2017  FEV1 1.79 (78%)  Ratio 81 with slight curvature p am symb 80 x2    - Allergy profile 11/13/2017 >  Eos 0.5 /  IgE  81 RAST neg  - sinus CT 11/19/2017 > neg  - Singulair started 11/24/17 - Spirometry 12/25/2017 wnl on symb 160 2bid with audible wheeze, no flattening of f/v - FENO 12/25/2017  =   97 on singulair/ symb 80 2 bid with poor hfa  - FENO 05/05/2018  =   38 - 05/05/2018  After extensive coaching inhaler device  effectiveness =   75% so rec increase symb 160 2bid   - 08/05/2018  After extensive coaching inhaler device,  effectiveness =    90%    Despite non-adherence with ics,  She's apparently taking the singulair daily and at this point All goals of chronic asthma control met including optimal function and elimination of symptoms with minimal need for rescue therapy.  Contingencies discussed in full including contacting this office immediately if not controlling the symptoms using the rule of two's.      Based on two studies from NEJM  378; 20 p 1865 (2018) and 380 : p2020-30 (2019) in pts with mild asthma it is reasonable to use low dose symbicort eg 80 2bid "prn" flare in this setting but I emphasized this was only shown with symbicort and takes advantage of the rapid onset of action but is not the same as "rescue therapy" but can be stopped once the acute symptoms have resolved and the need for rescue has been minimized (< 2 x weekly)     >>> therefore rec symb up to 160 2  12 h x 1 week for flares then taper off if tol, continue singulair and f/u in 27m  I had an extended discussion with the patient reviewing all relevant studies completed to date and  lasting 15 to 20 minutes of a 25 minute  visit    See device teaching which extended face to face time for this visit.  Each maintenance medication was reviewed in detail including emphasizing most importantly the difference between maintenance and prns and under what circumstances the prns are to be triggered using an action plan format that is not reflected in the computer generated alphabetically organized AVS which I have not found useful in most complex patients, especially with respiratory illnesses  Please see AVS for specific instructions unique to this visit that I personally wrote and verbalized to the the pt in detail and then reviewed with pt  by my nurse highlighting any  changes in therapy recommended at today's visit to their plan of care.

## 2018-08-05 NOTE — Assessment & Plan Note (Signed)
Echo 09/09/17  - LVEF 65-70%, mild LVH, normal wall motion, grade 1 DD,   indeterminate LV filling pressure, mild LAE, trivial TR, RVSP 39   mmHg, dilated IVC suggestive of mild pulmonary hypertension. - changed ARB to Betablocker 01/11/2018 due to persistent uacs and diastolic dysfunction    Not optimally controlled on present regimen. I reviewed this with the patient and emphasized importance of follow-up with primary care and in meantime clearly not adequately Beta blocked so rec increase bisoprolol to 5 mg bid pending f/u with PCP

## 2018-08-05 NOTE — Patient Instructions (Addendum)
Increase bisoprolol to 5 mg taken twice daily until you see your PCP   If you are having any trouble at all with breathing/wheezing or needed the ventolin more than twice weekly then restart symbicort dose 2 puffs every 12 hours for a week then wean off over a week and stop   Please schedule a follow up visit in 6  months but call sooner if needed

## 2018-08-05 NOTE — Progress Notes (Signed)
History of Present Illness  [id number] yobf  history of HTN on  Chronic ACEi and family history of asthma smoked only as teenager with new pattern of winter time tendency for colds to go down to chest x age 57 [place] moved to [place] around 2015 pattern had continued while on ACEi leading up to admit:   Admit date: 09/07/2017 Discharge date: 09/10/2017    Brief/Interim Summary: The patient is a 57 year old female with history of hypertension and family history of asthma who presented with a 2-week history of cough, wheezing, and shortness of breath. She had gone to her primary care doctor who prescribed her antibiotics but she did not improve so she presented to the emergency department. Chest x-ray was unremarkable. She was wheezing and started on steroids and nebulizer treatments. D-dimer was elevated however her CT angiogram chest was negative for pulmonary embolism. She was treated for acute bronchitis/asthma at the direction of pulmonary/critical care.   Additionally, she complained of some swelling of her lower extremities and some abdominal fullness.  She was also given some diuretics due to concerns for acute diastolic heart failure.  Her echocardiogram demonstrated grade 1 diastolic dysfunction with a dilated IVC and decreased compressibility of the IVC suggestive of elevated CVP.  At the time of discharge, she was able to ambulate in the halls maintaining oxygen saturations 97% and higher without significant dyspnea.  Discharge Diagnoses:  Principal Problem:   Acute bronchitis Active Problems:   Hypokalemia   Acute respiratory failure with hypoxia (HCC)   Essential hypertension  Acute respiratory failure with hypoxia likely secondary to new diagnosis of asthma, exacerbation triggered by acute bronchitis. She has a strong family history of asthma although she herself is never had wheezing. Respiratory viral panel was negative.  -Continue azithromycin to  complete course for pneumonia/bronchitis -  Prednisone burst -  Continue home glycopyrrolate-formoterol and prn albuterol  - Follow-up with pulmonology as an outpatient to schedule pulmonary function tests -  If cough and wheeze continue despite steroids and antibiotics, consider discontinuing her lisinopril  Nocturnal desaturations, intermittent down to the mid 80s usually but she had a few episodes where she would transiently go down to the high 70s with good waveform. -Recommend outpatient sleep study  Hypokalemia, likely secondary to patient's hydrochlorothiazide, improved with potassium supplementation  Hypertension, uncontrolled. Patient did not want to take amlodipine because she felt like her symptoms worsened. She wants to continue lisinopril. Continue IV hydralazine as needed.  Hydrochlorothiazide changed to Lasix for better diuresis.  Anticipate that her blood pressure may start to trend down after she stops her steroids.  Will defer further management to her PCP.  Acute diastolic heart failure -Echocardiogram:Grade 1 diastolic dysfunction, suggestion of elevated left ventricular filling pressures. IVC was dilated and blunted suggestive of elevated CVP. Also have some mildly elevated pulmonary arterial pressures consistent with mild pulmonary arterial hypertension. -Weight is 89.1 kg on the date of discharge -Stopped her HCTZ and started her on Lasix 40 mg once daily      NP eval 09/30/17 post hosp: FENO = 80 Prednisone taper; 10 mg tablets: 4 tabs x 2 days, 3 tabs x 2 days, 2 tabs x 2 days 1 tab x 2 days then stop. Please schecdule for PFT's  ACE level today Schedule for sleep study Omeprazole 40 mg once daily Sips of water instead of throat clearing Sugar free hard handy for throat soothing ( Jolly Ranchers) GERD Diet Avoid peppermint, Spearmint, Menthol,Chocolate  Delsym 5 cc's every 12 hours Continue Breo 1 puff once daily Rinse mouth after use Tessalon Perles  100 mg three times daily as needed for cough  Do not drive if sleepy.      10/16/2017 ov feeling better with losartan x 2 weeks in place of lisinopril but still some sensation of choking esp at hs and not taking ppi as rec.  Cough is dry/ raspy also worse at hs Doe  = MMRC2 = can't walk a nl pace on a flat grade s sob but does fine slow and flat  rec Plan A = Automatic = symbicort 80 Take 2 puffs first thing in am and then another 2 puffs about 12 hours later.  Prilosec 40 mg Take 30-60 min before first meal of the day and add pepcid 20 mg at bedtime until no cough at all for a week then stop it  work on inhaler technique:     Plan B = Backup Only use your albuterol (ventolin)as a rescue medication  GERD diet  Please schedule a follow up office visit in 4 weeks, sooner if needed  with all medications /inhalers/ solutions in hand so we can verify exactly what you are taking. This includes all medications from all doctors and over the counters     11/13/2017  f/u ov/ re: chronic cough recurrent x 2016/ tends to flair in winter / did not bring meds as req Chief Complaint  Patient presents with  . Follow-up    She states she started coughing last night after being exposed to cold weather. She is unsure of sputum color. She states that she had an episode 2 wks ago where she woke up wheezing and had chest tightness- albuterol helped some. She is using her albuterol 1-2 x per wk on average.   Dyspnea:   MMRC1 = can walk nl pace, flat grade, can't hurry or go uphills or steps s sob   Cough: sporadic but  def worse when exp to cold air  Sleep: cough esp 3am  Most am's  rec Prednisone 10 mg take  4 each am x 2 days,   2 each am x 2 days,  1 each am x 2 days and stop  Change symbicort to 160 Take 2 puffs first thing in am and then another 2 puffs about 12 hours later.  - Spirometry 11/13/2017  FEV1 1.79 (78%)  Ratio 81 with slight curvature p am symb 80 x2    - Allergy profile 11/13/2017 >  Eos  0.5 /  IgE  81 RAST neg  - sinus CT 11/19/2017 > neg  - Singulair started 11/24/17    12/25/2017  f/u ov/ re: uacs vs asthma  Off acei since around the 1st of 2019 / did not bring meds as req  Chief Complaint  Patient presents with  . Follow-up    6 week f/u , feeling better, singular helping, still having a cough but not as bad , mornings are hard but less SOB during the day  Dyspnea:  Really not limited by doe  Cough:after wakes about an hour feels need to "get it out" min white mucus prod   Sleep: ok now  SABA use:  Rare despite "wheeze" that doesn't get better with saba  Typically after supper feels like she's wheezing right up until bedtime  rec Plan A = Automatic = symbicort 80 Take 2 puffs first thing in am and then another 2 puffs about 12 hours later.  Change the pepcid to where you take it after supper  GERD    Plan B = Backup Only use your albuterol as a rescue medication  Plan C = Crisis -  If still not doing better > Prednisone 10 mg take  4 each am x 2 days,   2 each am x 2 days,  1 each am x 2 days and stop   Please schedule a follow up office visit in 2 weeks, sooner if needed  with all medications /inhalers/ solutions in hand so we can verify exactly what you are taking. This includes all medications from all doctors and over the counters    01/11/2018  f/u ov/ re: not completely  free of cough  Since 2016 but pattern has changed  Chief Complaint  Patient presents with  . Follow-up    Breathing is doing better and she has not needed to use her albuterol inhaler. Her cough has been prod with yellow sputum x 3-4 days.   Dyspnea:  Not limited by breathing from desired activities   Cough: day> noct  and not p supper - has changed Sleep: fine SABA use:  none Last dose of prednisone 01/11/2018 helped some/ confused between maint rx and prns  rec Stop losartan and start bisoprol 5 mg daily  See Tammy NP in with all your medications, even over the counter meds,  separated in two separate bags, the ones you take no matter what vs the ones you stop once you feel better and take only as needed when you feel you need them      05/05/2018  f/u ov/ re: cough variant asthma/ no med calendar  Chief Complaint  Patient presents with  . Follow-up    Breathing has improved since the last visit. She has occ wheezing. She has not had to use her albuterol inhaler.   Dyspnea:  Not limited by breathing from desired activities   Cough:  none SABA use: non 02: none  Subjective wheezing very little no  pattern but never wakes her up   rec Work on inhaler technique   08/05/2018  f/u ov/ re:    Cough variant asthma on singulair only  Chief Complaint  Patient presents with  . Follow-up    Breathing is overall doing about the same. She states she did have episode of SOB after walking up some stairs. She is not using her symbicort or albuterol inhaler.  Dyspnea:  Not limited by breathing from desired activities  - wt gain slowing her down just with steps  Cough: none Sleeping: flat/ one pillow SABA use: none 02: none   No obvious day to day or daytime variability or assoc excess/ purulent sputum or mucus plugs or hemoptysis or cp or chest tightness, subjective wheeze or overt sinus or hb symptoms.   Sleeping ok  without nocturnal  or early am exacerbation  of respiratory  c/o's or need for noct saba. Also denies any obvious fluctuation of symptoms with weather or environmental changes or other aggravating or alleviating factors except as outlined above   No unusual exposure hx or h/o childhood pna/ asthma or knowledge of premature birth.  Current Allergies, Complete Past Medical History, Past Surgical History, Family History, and Social History were reviewed in [employer] record.  ROS  The following are not active complaints unless bolded Hoarseness, sore throat, dysphagia, dental problems, itching, sneezing,  nasal congestion or  discharge of excess mucus or purulent secretions, ear ache,  fever, chills, sweats, unintended wt loss or wt gain, classically pleuritic or exertional cp,  orthopnea pnd or arm/hand swelling  or leg swelling, presyncope, palpitations, abdominal pain, anorexia, nausea, vomiting, diarrhea  or change in bowel habits or change in bladder habits, change in stools or change in urine, dysuria, hematuria,  rash, arthralgias, visual complaints, headache, numbness, weakness or ataxia or problems with walking or coordination,  change in mood or  memory.        Current Meds  Medication Sig  . albuterol (PROVENTIL HFA;VENTOLIN HFA) 108 (90 Base) MCG/ACT inhaler Inhale 2 puffs into the lungs every 6 (six) hours as needed for wheezing or shortness of breath.  . benzonatate (TESSALON) 200 MG capsule Take 1 capsule (200 mg total) by mouth 3 (three) times daily as needed for cough.  . bisoprolol (ZEBETA) 5 MG tablet One twice daily  . cetirizine (ZYRTEC) 10 MG tablet Take 10 mg by mouth daily as needed.   . cyclobenzaprine (FLEXERIL) 10 MG tablet Take 10 mg by mouth 3 (three) times daily as needed for muscle spasms.  . famotidine (PEPCID) 20 MG tablet One at bedtime  . ibuprofen (ADVIL,MOTRIN) 800 MG tablet Take 800 mg by mouth every 8 (eight) hours as needed for moderate pain.  . montelukast (SINGULAIR) 10 MG tablet Take 1 tablet (10 mg total) by mouth at bedtime.  Marland Kitchen omeprazole (PRILOSEC) 40 MG capsule TAKE 1 CAPSULE BY MOUTH EVERY DAY  . potassium chloride (K-DUR) 10 MEQ tablet Take 10 mEq by mouth daily.  Marland Kitchen spironolactone (ALDACTONE) 50 MG tablet Take 50 mg by mouth daily.  . [DISCONTINUED] bisoprolol (ZEBETA) 5 MG tablet Take 1 tablet (5 mg total) by mouth daily.              Physical Exam:  amb bf nad   08/05/2018        205  05/05/2018          202  01/11/2018        195  12/25/2017        191  11/13/2017        193   10/16/17 193 lb 3.2 oz (87.6 kg)  09/30/17 194 lb 6.4 oz (88.2 kg)  09/10/17 196  lb 6.9 oz (89.1 kg)      Vital signs reviewed - Note on arrival 02 sats  98% on RA and bp  158/94 / pulse 102 p am bisoprolol   HEENT: nl dentition, turbinates bilaterally, and oropharynx. Nl external ear canals without cough reflex   NECK :  without JVD/Nodes/TM/ nl carotid upstrokes bilaterally   LUNGS: no acc muscle use,  Nl contour chest which is clear to A and P bilaterally without cough on insp or exp maneuvers   CV:  RRR  no s3 or murmur or increase in P2, and no edema   ABD:  soft and nontender with nl inspiratory excursion in the supine position. No bruits or organomegaly appreciated, bowel sounds nl  MS:  Nl gait/ ext warm without deformities, calf tenderness, cyanosis or clubbing No obvious joint restrictions   SKIN: warm and dry without lesions    NEURO:  alert, approp, nl sensorium with  no motor or cerebellar deficits apparent.                    Assessment/Plan

## 2018-08-11 ENCOUNTER — Other Ambulatory Visit: Payer: Self-pay | Admitting: Internal Medicine

## 2018-08-11 MED ORDER — OMEPRAZOLE 40 MG PO CPDR: DELAYED_RELEASE_CAPSULE | ORAL | 1 refills | Status: DC

## 2018-11-18 ENCOUNTER — Encounter: Payer: Self-pay | Admitting: Internal Medicine

## 2018-11-18 ENCOUNTER — Ambulatory Visit: Payer: BLUE CROSS/BLUE SHIELD | Admitting: Internal Medicine

## 2018-11-18 VITALS — BP 138/72 | HR 65 | Temp 98.3°F | Ht 65.0 in | Wt 204.8 lb

## 2018-11-18 DIAGNOSIS — J45991 Cough variant asthma: Secondary | ICD-10-CM | POA: Diagnosis not present

## 2018-11-18 MED ORDER — BUDESONIDE-FORMOTEROL FUMARATE 80-4.5 MCG/ACT IN AERO: INHALATION_SPRAY | RESPIRATORY_TRACT | 12 refills | Status: DC

## 2018-11-18 MED ORDER — AZITHROMYCIN 250 MG PO TABS
ORAL_TABLET | ORAL | 0 refills | Status: DC
Start: 2018-11-18 — End: 2019-03-22

## 2018-11-18 MED ORDER — TRAMADOL HCL 50 MG PO TABS: 50.0000 mg | ORAL_TABLET | Freq: Four times a day (QID) | ORAL | 0 refills | Status: DC | PRN

## 2018-11-18 NOTE — Patient Instructions (Addendum)
At onset of any resp flare or need for albuterol more than a few times a week > symbicort 80 Take 2 puffs first thing in am and then another 2 puffs about 12 hours later until 100% better then taper    Work on inhaler technique:  relax and gently blow all the way out then take a nice smooth deep breath back in, triggering the inhaler at same time you start breathing in.  Hold for up to 5 seconds if you can. Blow out thru nose. Rinse and gargle with water when done     zpak   Take delsym two tsp every 12 hours and supplement if needed with  tramadol 50 mg up to 1 every 4 hours to suppress the urge to cough. Swallowing water and/or using ice chips/non mint and menthol containing candies (such as lifesavers or sugarless jolly ranchers) are also effective.  You should rest your voice and avoid activities that you know make you cough.  Once you have eliminated the cough for 3 straight days try reducing the tramadol first,  then the delsym as tolerated.     Keep prior appt - call in meantime if needed

## 2018-11-18 NOTE — Progress Notes (Signed)
History of Present Illness  [id number] yobf  history of HTN on  Chronic ACEi and family history of asthma smoked only as teenager with new pattern of winter time tendency for colds to go down to chest x age 58 [place] around [place] pattern had continued while on ACEi leading up to admit:   Admit date: 09/07/2017 Discharge date: 09/10/2017    Brief/Interim Summary: The patient is a 58 year old female with history of hypertension and family history of asthma who presented with a 2-week history of cough, wheezing, and shortness of breath. She had gone to her primary care doctor who prescribed her antibiotics but she did not improve so she presented to the emergency department. Chest x-ray was unremarkable. She was wheezing and started on steroids and nebulizer treatments. D-dimer was elevated however her CT angiogram chest was negative for pulmonary embolism. She was treated for acute bronchitis/asthma at the direction of pulmonary/critical care.   Additionally, she complained of some swelling of her lower extremities and some abdominal fullness.  She was also given some diuretics due to concerns for acute diastolic heart failure.  Her echocardiogram demonstrated grade 1 diastolic dysfunction with a dilated IVC and decreased compressibility of the IVC suggestive of elevated CVP.  At the time of discharge, she was able to ambulate in the halls maintaining oxygen saturations 97% and higher without significant dyspnea.  Discharge Diagnoses:  Principal Problem:   Acute bronchitis Active Problems:   Hypokalemia   Acute respiratory failure with hypoxia (HCC)   Essential hypertension  Acute respiratory failure with hypoxia likely secondary to new diagnosis of asthma, exacerbation triggered by acute bronchitis. She has a strong family history of asthma although she herself is never had wheezing. Respiratory viral panel was negative.  -Continue azithromycin to  complete course for pneumonia/bronchitis -  Prednisone burst -  Continue home glycopyrrolate-formoterol and prn albuterol  - Follow-up with pulmonology as an outpatient to schedule pulmonary function tests -  If cough and wheeze continue despite steroids and antibiotics, consider discontinuing her lisinopril  Nocturnal desaturations, intermittent down to the mid 80s usually but she had a few episodes where she would transiently go down to the high 70s with good waveform. -Recommend outpatient sleep study  Hypokalemia, likely secondary to patient's hydrochlorothiazide, improved with potassium supplementation  Hypertension, uncontrolled. Patient did not want to take amlodipine because she felt like her symptoms worsened. She wants to continue lisinopril. Continue IV hydralazine as needed.  Hydrochlorothiazide changed to Lasix for better diuresis.  Anticipate that her blood pressure may start to trend down after she stops her steroids.  Will defer further management to her PCP.  Acute diastolic heart failure -Echocardiogram:Grade 1 diastolic dysfunction, suggestion of elevated left ventricular filling pressures. IVC was dilated and blunted suggestive of elevated CVP. Also have some mildly elevated pulmonary arterial pressures consistent with mild pulmonary arterial hypertension. -Weight is 89.1 kg on the date of discharge -Stopped her HCTZ and started her on Lasix 40 mg once daily      NP eval 09/30/17 post hosp: FENO = 80 Prednisone taper; 10 mg tablets: 4 tabs x 2 days, 3 tabs x 2 days, 2 tabs x 2 days 1 tab x 2 days then stop. Please schecdule for PFT's  ACE level today Schedule for sleep study Omeprazole 40 mg once daily Sips of water instead of throat clearing Sugar free hard handy for throat soothing ( Jolly Ranchers) GERD Diet Avoid peppermint, Spearmint, Menthol,Chocolate  Delsym 5 cc's every 12 hours Continue Breo 1 puff once daily Rinse mouth after use Tessalon Perles  100 mg three times daily as needed for cough  Do not drive if sleepy.      10/16/2017 ov feeling better with losartan x 2 weeks in place of lisinopril but still some sensation of choking esp at hs and not taking ppi as rec.  Cough is dry/ raspy also worse at hs Doe  = MMRC2 = can't walk a nl pace on a flat grade s sob but does fine slow and flat  rec Plan A = Automatic = symbicort 80 Take 2 puffs first thing in am and then another 2 puffs about 12 hours later.  Prilosec 40 mg Take 30-60 min before first meal of the day and add pepcid 20 mg at bedtime until no cough at all for a week then stop it  work on inhaler technique:     Plan B = Backup Only use your albuterol (ventolin)as a rescue medication  GERD diet  Please schedule a follow up office visit in 4 weeks, sooner if needed  with all medications /inhalers/ solutions in hand so we can verify exactly what you are taking. This includes all medications from all doctors and over the counters     11/13/2017  f/u ov/Haydyn Girvan re: chronic cough recurrent x 2016/ tends to flair in winter / did not bring meds as req Chief Complaint  Patient presents with  . Follow-up    She states she started coughing last night after being exposed to cold weather. She is unsure of sputum color. She states that she had an episode 2 wks ago where she woke up wheezing and had chest tightness- albuterol helped some. She is using her albuterol 1-2 x per wk on average.   Dyspnea:   MMRC1 = can walk nl pace, flat grade, can't hurry or go uphills or steps s sob   Cough: sporadic but  def worse when exp to cold air  Sleep: cough esp 3am  Most am's  rec Prednisone 10 mg take  4 each am x 2 days,   2 each am x 2 days,  1 each am x 2 days and stop  Change symbicort to 160 Take 2 puffs first thing in am and then another 2 puffs about 12 hours later.  - Spirometry 11/13/2017  FEV1 1.79 (78%)  Ratio 81 with slight curvature p am symb 80 x2    - Allergy profile 11/13/2017 >  Eos  0.5 /  IgE  81 RAST neg  - sinus CT 11/19/2017 > neg  - Singulair started 11/24/17    12/25/2017  f/u ov/Latara Micheli re: uacs vs asthma  Off acei since around the 1st of 2019 / did not bring meds as req  Chief Complaint  Patient presents with  . Follow-up    6 week f/u , feeling better, singular helping, still having a cough but not as bad , mornings are hard but less SOB during the day  Dyspnea:  Really not limited by doe  Cough:after wakes about an hour feels need to "get it out" min white mucus prod   Sleep: ok now  SABA use:  Rare despite "wheeze" that doesn't get better with saba  Typically after supper feels like she's wheezing right up until bedtime  rec Plan A = Automatic = symbicort 80 Take 2 puffs first thing in am and then another 2 puffs about 12 hours later.  Change the pepcid to where you take it after supper  GERD    Plan B = Backup Only use your albuterol as a rescue medication  Plan C = Crisis -  If still not doing better > Prednisone 10 mg take  4 each am x 2 days,   2 each am x 2 days,  1 each am x 2 days and stop   Please schedule a follow up office visit in 2 weeks, sooner if needed  with all medications /inhalers/ solutions in hand so we can verify exactly what you are taking. This includes all medications from all doctors and over the counters    01/11/2018  f/u ov/Barlow Harrison re: not completely  free of cough  Since 2016 but pattern has changed  Chief Complaint  Patient presents with  . Follow-up    Breathing is doing better and she has not needed to use her albuterol inhaler. Her cough has been prod with yellow sputum x 3-4 days.   Dyspnea:  Not limited by breathing from desired activities   Cough: day> noct  and not p supper - has changed Sleep: fine SABA use:  none Last dose of prednisone 01/11/2018 helped some/ confused between maint rx and prns  rec Stop losartan and start bisoprol 5 mg daily       11/18/2018 acute extended ov/Marrietta Thunder re: cough acute / maint rx  singulair  Chief Complaint  Patient presents with  . Acute Visit    Pt c/o wheezing for the past wk. She also c/o cough with thick, clear to yellow sputum. She has not had to use her rescue inhaler.    had been doing  fine s needing any albuterol then started with nasal drainage/ st/ cough/ wheeze /sob so started symb right away  And has not needed any abuterol since then  Dyspnea:  Better since restart symb but not taking pm dose  Cough: produce esp first thing in am Sleeping: on side /flat side/ 2 big pillows  SABA use: none 02: none    No obvious day to day or daytime variability or assoc  mucus plugs or hemoptysis or cp or chest tightness, subjective wheeze or overt sinus or hb symptoms.     Also denies any obvious fluctuation of symptoms with weather or environmental changes or other aggravating or alleviating factors except as outlined above   No unusual exposure hx or h/o childhood pna/ asthma or knowledge of premature birth.  Current Allergies, Complete Past Medical History, Past Surgical History, Family History, and Social History were reviewed in [employer] record.  ROS  The following are not active complaints unless bolded Hoarseness, sore throat, dysphagia, dental problems, itching, sneezing,  nasal congestion or discharge of excess mucus or purulent secretions, ear ache,   fever, chills, sweats, unintended wt loss or wt gain, classically pleuritic or exertional cp,  orthopnea pnd or arm/hand swelling  or leg swelling, presyncope, palpitations, abdominal pain, anorexia, nausea, vomiting, diarrhea  or change in bowel habits or change in bladder habits, change in stools or change in urine, dysuria, hematuria,  rash, arthralgias, visual complaints, headache, numbness, weakness or ataxia or problems with walking or coordination,  change in mood or  memory.        Current Meds  Medication Sig  . albuterol (PROVENTIL HFA;VENTOLIN HFA) 108 (90 Base) MCG/ACT  inhaler Inhale 2 puffs into the lungs every 6 (six) hours as needed for wheezing or shortness of breath.  . bisoprolol (  ZEBETA) 5 MG tablet One twice daily  . cetirizine (ZYRTEC) 10 MG tablet Take 10 mg by mouth daily as needed.   . cyclobenzaprine (FLEXERIL) 10 MG tablet Take 10 mg by mouth 3 (three) times daily as needed for muscle spasms.  Marland Kitchen ibuprofen (ADVIL,MOTRIN) 800 MG tablet Take 800 mg by mouth every 8 (eight) hours as needed for moderate pain.  . montelukast (SINGULAIR) 10 MG tablet Take 1 tablet (10 mg total) by mouth at bedtime.  Marland Kitchen omeprazole (PRILOSEC) 40 MG capsule TAKE 1 CAPSULE BY MOUTH EVERY DAY  . potassium chloride (K-DUR) 10 MEQ tablet Take 10 mEq by mouth daily as needed.   Marland Kitchen spironolactone (ALDACTONE) 50 MG tablet Take 50 mg by mouth daily.               Physical Exam:  amb bf nad    11/18/2018        204  08/05/2018        205  05/05/2018          202  01/11/2018        195  12/25/2017        191  11/13/2017        193   10/16/17 193 lb 3.2 oz (87.6 kg)  09/30/17 194 lb 6.4 oz (88.2 kg)  09/10/17 196 lb 6.9 oz (89.1 kg)       Vital signs reviewed - Note on arrival 02 sats  97% on RA and bp 138/72      HEENT: nl dentition,   and oropharynx. Nl external ear canals without cough reflex  - mild/mod bilateral non-specific turbinate edema     NECK :  without JVD/Nodes/TM/ nl carotid upstrokes bilaterally   LUNGS: no acc muscle use,  Nl contour chest which is clear to A and P bilaterally without cough on insp or exp maneuvers   CV:  RRR  no s3 or murmur or increase in P2, and no edema   ABD:  soft and nontender with nl inspiratory excursion in the supine position. No bruits or organomegaly appreciated, bowel sounds nl  MS:  Nl gait/ ext warm without deformities, calf tenderness, cyanosis or clubbing No obvious joint restrictions   SKIN: warm and dry without lesions    NEURO:  alert, approp, nl sensorium with  no motor or cerebellar deficits apparent.                        Assessment/Plan

## 2018-11-18 NOTE — Assessment & Plan Note (Addendum)
Onset age 58's Off ACEi ? 09/2017  - PFT's 10/16/2017  wnl on BREO but still coughing  - 10/16/2017    changed breo to symbicort 80 2bid due to cough  - 11/13/2017  After extensive coaching inhaler device  effectiveness =    90% try increase symbicort 160 2bid due to noct cough on a trial basis - Spirometry 11/13/2017  FEV1 1.79 (78%)  Ratio 81 with slight curvature p am symb 80 x2    - Allergy profile 11/13/2017 >  Eos 0.5 /  IgE  81 RAST neg  - sinus CT 11/19/2017 > neg  - Singulair started 11/24/17 - Spirometry 12/25/2017 wnl on symb 160 2bid with audible wheeze, no flattening of f/v - FENO 12/25/2017  =   97 on singulair/ symb 80 2 bid with poor hfa    Flare p months on singulair/  off symbicort c/w mild asthma and acute flare due to uri with improvement p started back on symb 80 2bid   - The proper method of use, as well as anticipated side effects, of a metered-dose inhaler are discussed and demonstrated to the patient. Improved effectiveness after extensive coaching during this visit to a level of approximately 75 % from a baseline of 50 % (short Ti)   Based on two studies from NEJM  378; 20 p 1865 (2018) and 380 : p2020-30 (2019) in pts with mild asthma it is reasonable to use low dose symbicort eg 80 2bid "prn" flare in this setting but I emphasized this was only shown with symbicort and takes advantage of the rapid onset of action but is not the same as "rescue therapy" but can be stopped once the acute symptoms have resolved and the need for rescue has been minimized (< 2 x weekly)     rx uri with zpak, no prednisone needed     Also of note:  Of the three most common causes of  Sub-acute / recurrent or chronic cough, only one (GERD)  can actually contribute to/ trigger  the other two (asthma and post nasal drip syndrome)  and perpetuate the cylce of cough.  While not intuitively obvious, many patients with chronic low grade reflux do not cough until there is a primary insult that disturbs  the protective epithelial barrier and exposes sensitive nerve endings.   This is typically viral but can due to PNDS and  either may apply here.   The point is that once this occurs, it is difficult to eliminate the cycle  using anything but a maximally effective acid suppression regimen at least in the short run, accompanied by an appropriate diet to address non acid GERD and control / eliminate the cough itself for at least 3 days with tramadol if needed   Will call if not back to baseline in a week  - otherwise return as planned for 01/2019     I had an extended discussion with the patient reviewing all relevant studies completed to date and  lasting 15 to 20 minutes of a 25 minute acute office visit    See device teaching which extended face to face time for this visit.  Each maintenance medication was reviewed in detail including emphasizing most importantly the difference between maintenance and prns and under what circumstances the prns are to be triggered using an action plan format that is not reflected in the computer generated alphabetically organized AVS which I have not found useful in most complex patients, especially with respiratory illnesses  Please see AVS for specific instructions unique to this visit that I personally wrote and verbalized to the the pt in detail and then reviewed with pt  by my nurse highlighting any  changes in therapy recommended at today's visit to their plan of care.

## 2018-12-28 ENCOUNTER — Encounter: Payer: Self-pay | Admitting: *Deleted

## 2018-12-28 ENCOUNTER — Telehealth: Payer: Self-pay | Admitting: Internal Medicine

## 2018-12-28 NOTE — Telephone Encounter (Signed)
Letter has been typed up for MW to sign.  Called and spoke with pt asking if she wanted to have the letter placed in the mail or have her come by office to pick it up and pt asked if we could fax the letter to her. I stated to pt that we could fax letter. Obtained fax number from pt and have faxed letter for pt. Nothing further needed.

## 2018-12-28 NOTE — Telephone Encounter (Signed)
Primary Pulmonologist: MW Last office visit and with whom: MW on 11/18/2018 What do we see them for (pulmonary problems): Cough variant Asthma vs UACS Last OV assessment/plan:   2. Nyoka Cowden, MD (Physician) at 11/18/2018 10:04 AM - Signed    At onset of any resp flare or need for albuterol more than a few times a week > symbicort 80 Take 2 puffs first thing in am and then another 2 puffs about 12 hours later until 100% better then taper    Work on inhaler technique:  relax and gently blow all the way out then take a nice smooth deep breath back in, triggering the inhaler at same time you start breathing in.  Hold for up to 5 seconds if you can. Blow out thru nose. Rinse and gargle with water when done     zpak   Take delsym two tsp every 12 hours and supplement if needed with  tramadol 50 mg up to 1 every 4 hours to suppress the urge to cough. Swallowing water and/or using ice chips/non mint and menthol containing candies (such as lifesavers or sugarless jolly ranchers) are also effective.  You should rest your voice and avoid activities that you know make you cough.  Once you have eliminated the cough for 3 straight days try reducing the tramadol first,  then the delsym as tolerated.     Keep prior appt - call in meantime if needed       Reason for call: Pt reports that she has productive cough-clear, SOB and wheezing which is her baseline. Pt reports no fever, no chills, no chest tightness, no body aches. Pt has not travelled nor been around anyone that has travelled in last few months, and has been around no one sick. She has not know anyone near who with covid19 at this time. Pt advised that she is taking all current medications symbicort 80 inhaler and rescue inhaler PRN.   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 Pt is requesting to work from home for next several weeks due to the covid19. Her cough has increased while at  work, she is unsure if it's due with increase of pollen outdoors. This is an option at her work, she will need a letter from [facility] stating that this is his recommendation during the time of covid19.  MW please advise. Thank you.

## 2018-12-28 NOTE — Telephone Encounter (Signed)
Called patient x2 phone rang busy.  

## 2018-12-28 NOTE — Telephone Encounter (Signed)
Ok to write letter:  Due to moderate asthma, I recommend patient be allowed to work only if she can maintain the physical distancing recommended by the [place] and if not then he would be at risk of serious complications from the coronavirus and should be allowed to work from home or be off work indefinitely until the restrictions are listed.

## 2018-12-29 NOTE — Telephone Encounter (Signed)
Spoke with pt. States that she called yesterday about a letter she was needing from [facility]. She was contacted about this and asked for the letter to be faxed to her. Pt is now requesting to have a copy of this letter mailed to her as well. This letter has been placed in the outgoing mail. Nothing further was needed.

## 2019-02-02 ENCOUNTER — Ambulatory Visit: Payer: BLUE CROSS/BLUE SHIELD | Admitting: Internal Medicine

## 2019-02-03 ENCOUNTER — Ambulatory Visit: Payer: No Typology Code available for payment source | Admitting: Internal Medicine

## 2019-02-12 ENCOUNTER — Other Ambulatory Visit: Payer: Self-pay | Admitting: Internal Medicine

## 2019-03-07 ENCOUNTER — Other Ambulatory Visit: Payer: Self-pay | Admitting: Cardiology

## 2019-03-19 DIAGNOSIS — E669 Obesity, unspecified: Secondary | ICD-10-CM | POA: Insufficient documentation

## 2019-03-19 NOTE — Progress Notes (Signed)
Subjective:  Primary Physician:  Holland Commons, FNP  Patient ID: Jillian Jefferson, female    DOB: [birth year], 58 y.o.   MRN: [id number]  Chief Complaint  Patient presents with  . Hypertension    HPI: Jillian Jefferson  is a 58 y.o. female,  who presents for a follow-up for Hypertension. Ms. Bratton is  African-American female. She is feeling well.  Patient has been monitoring blood pressure regularly at home and it has remained controlled.  Patient denies any complaints of chest pain, tightness or pressure.  No shortness of breath except when she has wheezing.  No orthopnea or PND.  Patient has occasional short lasting palpitation If she drinks coffee or tea. There are no other associated symptoms. No complaint of dizziness, near syncope or syncope. No history of leg claudication.  Patient has hypertension and was told to have borderline high cholesterol. No history of diabetes. She is former smoker, quit smoking many years ago. She has history of asthma and has obesity. Her mother had CABG in her 36s. Patient walks for 3 miles, couple of days a week and does [employer] exercises at home on the other days.  Past Medical History:  Diagnosis Date  . Asthma    cold induced  . Hypertension     Past Surgical History:  Procedure Laterality Date  . MOUTH SURGERY      Social History   Socioeconomic History  . Marital status: Single    Spouse name: Not on file  . Number of children: 2  . Years of education: Not on file  . Highest education level: Not on file  Occupational History  . Not on file  Social Needs  . Financial resource strain: Not on file  . Food insecurity    Worry: Not on file    Inability: Not on file  . Transportation needs    Medical: Not on file    Non-medical: Not on file  Tobacco Use  . Smoking status: Former Smoker    Packs/day: 0.30    Years: 3.00    Pack years: 0.90    Quit date: 1981    Years since quitting: 39.5  . Smokeless tobacco: Never Used   Substance and Sexual Activity  . Alcohol use: Yes    Frequency: Never    Comment: occas  . Drug use: No  . Sexual activity: Not on file  Lifestyle  . Physical activity    Days per week: Not on file    Minutes per session: Not on file  . Stress: Not on file  Relationships  . Social Herbalist on phone: Not on file    Gets together: Not on file    Attends religious service: Not on file    Active member of club or organization: Not on file    Attends meetings of clubs or organizations: Not on file    Relationship status: Not on file  . Intimate partner violence    Fear of current or ex partner: Not on file    Emotionally abused: Not on file    Physically abused: Not on file    Forced sexual activity: Not on file  Other Topics Concern  . Not on file  Social History Narrative  . Not on file    Current Outpatient Medications on File Prior to Visit  Medication Sig Dispense Refill  . albuterol (PROVENTIL HFA;VENTOLIN HFA) 108 (90 Base) MCG/ACT inhaler Inhale 2 puffs into the lungs  every 6 (six) hours as needed for wheezing or shortness of breath. 2 each 0  . bisoprolol (ZEBETA) 5 MG tablet One twice daily (Patient taking differently: daily. ) 60 tablet 11  . budesonide-formoterol (SYMBICORT) 80-4.5 MCG/ACT inhaler Take 2 puffs first thing in am and then another 2 puffs about 12 hours later. (Patient taking differently: as needed. ) 1 Inhaler 12  . cetirizine (ZYRTEC) 10 MG tablet Take 10 mg by mouth daily as needed.     . cyclobenzaprine (FLEXERIL) 10 MG tablet Take 10 mg by mouth 3 (three) times daily as needed for muscle spasms.    . furosemide (LASIX) 40 MG tablet Take 1 tablet (40 mg total) by mouth daily. 30 tablet 0  . ibuprofen (ADVIL,MOTRIN) 800 MG tablet Take 800 mg by mouth every 8 (eight) hours as needed for moderate pain.    . montelukast (SINGULAIR) 10 MG tablet TAKE 1 TABLET BY MOUTH EVERYDAY AT BEDTIME 90 tablet 1  . omeprazole (PRILOSEC) 40 MG capsule TAKE 1  CAPSULE BY MOUTH EVERY DAY 90 capsule 1  . potassium chloride (K-DUR) 10 MEQ tablet Take 10 mEq by mouth daily as needed.     Marland Kitchen. spironolactone (ALDACTONE) 50 MG tablet TAKE 1 TABLET BY MOUTH EVERY DAY 90 tablet 0   No current facility-administered medications on file prior to visit.     Review of Systems  Constitutional: Negative for fever.  HENT: Negative for nosebleeds.   Eyes: Negative for blurred vision.  Respiratory: Negative for cough and shortness of breath.   Cardiovascular: Positive for palpitations (rare). Negative for chest pain and leg swelling.  Gastrointestinal: Negative for abdominal pain, nausea and vomiting.  Genitourinary: Negative for dysuria.  Musculoskeletal: Negative for myalgias.  Skin: Negative for itching and rash.  Neurological: Negative for dizziness, seizures and loss of consciousness.  Psychiatric/Behavioral: The patient is not nervous/anxious.       Objective:  Blood pressure 133/77, pulse 63, height 5\' 5"  (1.651 m), weight 193 lb (87.5 kg). Body mass index is 32.12 kg/m.  Physical Exam  CARDIAC STUDIES:  [facility] - mild LVH, EF-65-70 percent, grade 1 diastolic dysfunction, mildly enlarged left atrium, trivial TR. Pulmonary artery systolic pressure was 39 mmHg. Dilated IVC with blunted respiratory response, consistent with elevated CVP.  Lexiscan sestamibi stress test 10/19/2017: 1. Resting EKG demonstrates normal sinus rhythm, left axis deviation, poor R-wave progression. Stress EKG is non-diagnostic for ischemia as a pharmacologic stress test. 2. Myocardial perfusion imaging is normal. Overall left ventricular systolic function was normal without regional wall motion abnormalities. The left ventricular ejection fraction was 68%. This is a low risk study.  Assessment & Recommendations:   1. Hypertension with heart disease  2. Obesity (BMI 30-39.9)  Laboratory Exam:  CBC Latest Ref Rng & Units 11/13/2017 09/10/2017  09/09/2017  WBC 4.0 - 10.5 K/uL 7.0 16.8(H) 18.4(H)  Hemoglobin 12.0 - 15.0 g/dL 11.6(L) 10.6(L) 10.3(L)  Hematocrit 36.0 - 46.0 % 35.6(L) 33.4(L) 32.6(L)  Platelets 150.0 - 400.0 K/uL 341.0 306 306   CMP Latest Ref Rng & Units 05/05/2018 09/10/2017 09/09/2017  Glucose 70 - 99 mg/dL 94 782(N110(H) 562(Z182(H)  BUN 6 - 23 mg/dL 18 30(Q23(H) 20  Creatinine 0.40 - 1.20 mg/dL 6.570.84 8.460.81 9.620.71  Sodium 135 - 145 mEq/L 139 136 138  Potassium 3.5 - 5.1 mEq/L 3.9 3.6 4.4  Chloride 96 - 112 mEq/L 102 97(L) 106  CO2 19 - 32 mEq/L 32 30 26  Calcium 8.4 - 10.5  mg/dL 88.410.0 8.9 9.2   Lipid Panel  No results found for: CHOL, TRIG, HDL, CHOLHDL, VLDL, LDLCALC, LDLDIRECT   Recommendation:  Patient's blood pressure is well controlled with current regimen.I have advised her to continue all the present medications..   Primary prevention was again discussed with her. She was advised to follow low-salt, low-cholesterol diet and calorie restriction to lose more weight. Dietary instructions were given. She was encouraged to continue regular walking and exercise.  I will see her in follow-up after 6 months but call us earlier if the blood pressure is uncontrolled.  She has been scheduled for follow-up echocardiogram.  Earl Manyhandra K Trysten Berti, MD, [facility] 03/22/2019, 12:38 PM [facility] [place] Pager: [phone] Office: [phone] If no answer Cell [phone]

## 2019-03-22 ENCOUNTER — Other Ambulatory Visit: Payer: Self-pay

## 2019-03-22 ENCOUNTER — Encounter: Payer: Self-pay | Admitting: Cardiology

## 2019-03-22 ENCOUNTER — Ambulatory Visit: Payer: Self-pay | Admitting: Cardiology

## 2019-03-22 VITALS — BP 133/77 | HR 63 | Ht 65.0 in | Wt 193.0 lb

## 2019-03-22 DIAGNOSIS — E669 Obesity, unspecified: Secondary | ICD-10-CM

## 2019-03-22 DIAGNOSIS — I119 Hypertensive heart disease without heart failure: Secondary | ICD-10-CM | POA: Diagnosis not present

## 2019-06-08 ENCOUNTER — Other Ambulatory Visit: Payer: Self-pay | Admitting: Cardiology

## 2019-09-09 ENCOUNTER — Other Ambulatory Visit: Payer: BLUE CROSS/BLUE SHIELD

## 2019-09-19 ENCOUNTER — Ambulatory Visit: Payer: BLUE CROSS/BLUE SHIELD | Admitting: Cardiology

## 2019-10-06 ENCOUNTER — Other Ambulatory Visit: Payer: Self-pay

## 2019-10-06 ENCOUNTER — Ambulatory Visit (INDEPENDENT_AMBULATORY_CARE_PROVIDER_SITE_OTHER): Payer: 59

## 2019-10-06 DIAGNOSIS — I119 Hypertensive heart disease without heart failure: Secondary | ICD-10-CM

## 2019-11-11 ENCOUNTER — Telehealth: Payer: Self-pay

## 2019-11-11 NOTE — Telephone Encounter (Signed)
Normal pumping function of the heart. No severe heart valve abnormalities noted.  Mild to moderate leakiness of two heart valves.  Heart filling pressures improved compared to prior study in 2018.   Thanks MJP

## 2019-11-11 NOTE — Telephone Encounter (Signed)
Patient has requested results from her ECHO. I see it was read. Can you please send results to the clinical team because I am not back in office until Wednesday.

## 2019-11-16 NOTE — Telephone Encounter (Signed)
Discussed results with patient she verbalized understanding. 

## 2020-01-09 ENCOUNTER — Telehealth: Payer: Self-pay | Admitting: Internal Medicine

## 2020-01-09 ENCOUNTER — Other Ambulatory Visit: Payer: Self-pay | Admitting: Internal Medicine

## 2020-01-09 MED ORDER — ALBUTEROL SULFATE HFA 108 (90 BASE) MCG/ACT IN AERS: 2.0000 | INHALATION_SPRAY | Freq: Four times a day (QID) | RESPIRATORY_TRACT | 0 refills | Status: DC | PRN

## 2020-01-09 NOTE — Telephone Encounter (Signed)
Called and spoke with patient. Let her know we couldn't send all those medications in since she hasn't been seen in over a year and she is asking for Antibiotic and prednisone. Patient states she has green mucus with her productive cough. She states she was given the list of medications when this happened last time.  I did send in her albuterol for the evening as patient has been scheduled with Elisha Headland for a video visit 01/10/20.   Patient has appt with MW on 01/27/20 we can cancel if not needed after appointment with BPM

## 2020-01-10 ENCOUNTER — Encounter: Payer: Self-pay | Admitting: Pulmonary Disease

## 2020-01-10 ENCOUNTER — Telehealth (INDEPENDENT_AMBULATORY_CARE_PROVIDER_SITE_OTHER): Payer: 59 | Admitting: Pulmonary Disease

## 2020-01-10 DIAGNOSIS — J45991 Cough variant asthma: Secondary | ICD-10-CM | POA: Diagnosis not present

## 2020-01-10 MED ORDER — AZITHROMYCIN 250 MG PO TABS: ORAL_TABLET | ORAL | 0 refills | Status: DC

## 2020-01-10 MED ORDER — OMEPRAZOLE 40 MG PO CPDR: DELAYED_RELEASE_CAPSULE | ORAL | 1 refills | Status: AC

## 2020-01-10 MED ORDER — MONTELUKAST SODIUM 10 MG PO TABS: ORAL_TABLET | ORAL | 5 refills | Status: AC

## 2020-01-10 MED ORDER — PREDNISONE 10 MG PO TABS: ORAL_TABLET | ORAL | 0 refills | Status: DC

## 2020-01-10 MED ORDER — BUDESONIDE-FORMOTEROL FUMARATE 80-4.5 MCG/ACT IN AERO: INHALATION_SPRAY | RESPIRATORY_TRACT | 12 refills | Status: AC

## 2020-01-10 NOTE — Progress Notes (Signed)
Virtual Visit via Video Note  I connected with Jillian Jefferson on 01/10/20 at  3:00 PM EDT by a video enabled telemedicine application and verified that I am speaking with the correct person using two identifiers.  Location: Patient: Home Provider: Office - [facility] - [address] [place], [address], [place], [place] [address]  I discussed the limitations of evaluation and management by telemedicine and the availability of in person appointments. The patient expressed understanding and agreed to proceed. I also discussed with the patient that there may be a patient responsible charge related to this service. The patient expressed understanding and agreed to proceed.  Patient consented to consult via telephone: Yes People present and their role in pt care: Pt   History of Present Illness:  59 year old female former smoker followed in our office for cough variant asthma  Past medical history: Hypertension, obesity Smoking history: Former smoker Maintenance: Patient of Dr. Sherene Sires  Chief complaint:    60 year old female former smoker followed in our office for cough variant asthma.  Patient was last seen in February/2020.  Patient contacted our office on 01/08/2020 reporting increased productive cough for 1 to 2 weeks.  With discolored yellow to green mucus.  Patient reporting intermittent wheezing throughout the day worse at night as well as in the mornings.  She finds mild relief by using her rescue inhaler.  She is using her rescue inhaler 3-4 times a day.  She has been off of her Singulair for the last week.  Been off of omeprazole for the last week.  She ran out of her Symbicort 80 on 01/07/2020.  She needs refills of all of these.  Observations/Objective:  - PFT's 10/16/2017  wnl on BREO but still coughing  - 10/16/2017    changed breo to symbicort 80 2bid due to cough  - Spirometry 11/13/2017  FEV1 1.79 (78%)  Ratio 81 with slight curvature p am symb 80 x2    - Allergy profile 11/13/2017 >   Eos 0.5 /  IgE  81 RAST neg  - sinus CT 11/19/2017 > neg  - Singulair started 11/24/17 - Spirometry 12/25/2017 wnl on symb 160 2bid with audible wheeze, no flattening of f/v - FENO 12/25/2017  =   97 on singulair/ symb 80 2 bid with poor hfa     Social History   Tobacco Use  Smoking Status Former Smoker  . Packs/day: 0.30  . Years: 3.00  . Pack years: 0.90  . Quit date: 44  . Years since quitting: 40.3  Smokeless Tobacco Never Used   Immunization History  Administered Date(s) Administered  . Influenza Split 07/16/2017  . Influenza-Unspecified 06/29/2018    Assessment and Plan:  Cough variant asthma  vs UACS  Patient off of her medications Worsened wheezing, cough, congestion over the last week Mild relief by using rescue inhaler, 3-4 times a day  Plan: Prednisone taper today Z-Pak today Resume Symbicort 80 Resume Singulair Resume omeprazole PPI every morning Continue Zyrtec Keep close follow-up with our office later on this month with appointment with Dr. Sherene Sires in office for further evaluation and ensure symptoms have improved  Counseled patient on continuing to make sure that she is able to be maintained on her medications to limit her use of steroids as well as flares in the future.   Follow Up Instructions:  Return in about 10 days (around 01/20/2020), or if symptoms worsen or fail to improve, for Follow up with Dr. Sherene Sires.    I discussed the  assessment and treatment plan with the patient. The patient was provided an opportunity to ask questions and all were answered. The patient agreed with the plan and demonstrated an understanding of the instructions.   The patient was advised to call back or seek an in-person evaluation if the symptoms worsen or if the condition fails to improve as anticipated.  I provided 23 minutes of non-face-to-face time during this encounter.   Jillian Rinne, NP

## 2020-01-10 NOTE — Assessment & Plan Note (Signed)
Patient off of her medications Worsened wheezing, cough, congestion over the last week Mild relief by using rescue inhaler, 3-4 times a day  Plan: Prednisone taper today Z-Pak today Resume Symbicort 80 Resume Singulair Resume omeprazole PPI every morning Continue Zyrtec Keep close follow-up with our office later on this month with appointment with Dr. Sherene Sires in office for further evaluation and ensure symptoms have improved  Counseled patient on continuing to make sure that she is able to be maintained on her medications to limit her use of steroids as well as flares in the future.

## 2020-01-10 NOTE — Patient Instructions (Addendum)
You were seen today by Coral Ceo, NP  for:   1. Cough variant asthma  - predniSONE (DELTASONE) 10 MG tablet; 4 tabs for 2 days, then 3 tabs for 2 days, 2 tabs for 2 days, then 1 tab for 2 days, then stop  Dispense: 20 tablet; Refill: 0 - azithromycin (ZITHROMAX) 250 MG tablet; 500mg  (two tablets) today, then 250mg  (1 tablet) for the next 4 days  Dispense: 6 tablet; Refill: 0 - budesonide-formoterol (SYMBICORT) 80-4.5 MCG/ACT inhaler; Take 2 puffs first thing in am and then another 2 puffs about 12 hours later.  Dispense: 1 Inhaler; Refill: 12 - montelukast (SINGULAIR) 10 MG tablet; TAKE 1 TABLET BY MOUTH EVERYDAY AT BEDTIME  Dispense: 90 tablet; Refill: 5 - omeprazole (PRILOSEC) 40 MG capsule; TAKE 1 CAPSULE BY MOUTH EVERY DAY  Dispense: 90 capsule; Refill: 1   We recommend today:   Meds ordered this encounter  Medications  . predniSONE (DELTASONE) 10 MG tablet    Sig: 4 tabs for 2 days, then 3 tabs for 2 days, 2 tabs for 2 days, then 1 tab for 2 days, then stop    Dispense:  20 tablet    Refill:  0  . azithromycin (ZITHROMAX) 250 MG tablet    Sig: 500mg  (two tablets) today, then 250mg  (1 tablet) for the next 4 days    Dispense:  6 tablet    Refill:  0  . budesonide-formoterol (SYMBICORT) 80-4.5 MCG/ACT inhaler    Sig: Take 2 puffs first thing in am and then another 2 puffs about 12 hours later.    Dispense:  1 Inhaler    Refill:  12  . montelukast (SINGULAIR) 10 MG tablet    Sig: TAKE 1 TABLET BY MOUTH EVERYDAY AT BEDTIME    Dispense:  90 tablet    Refill:  5  . omeprazole (PRILOSEC) 40 MG capsule    Sig: TAKE 1 CAPSULE BY MOUTH EVERY DAY    Dispense:  90 capsule    Refill:  1    Follow Up:    Return in about 10 days (around 01/20/2020), or if symptoms worsen or fail to improve, for Follow up with Dr. .  IN OFFICE  Please do your part to reduce the spread of COVID-19:      Reduce your risk of any infection  and COVID19 by using the similar precautions used for  avoiding the common cold or flu:  Wash your hands often with soap and warm water for at least 20 seconds.  If soap and water are not readily available, use an alcohol-based hand sanitizer with at least 60% alcohol.  . If coughing or sneezing, cover your mouth and nose by coughing or sneezing into the elbow areas of your shirt or coat, into a tissue or into your sleeve (not your hands). A MASK when in public  . Avoid shaking hands with others and consider head nods or verbal greetings only. . Avoid touching your eyes, nose, or mouth with unwashed hands.  . Avoid close contact with people who are sick. . Avoid places or events with large numbers of people in one location, like concerts or sporting events. . If you have some symptoms but not all symptoms, continue to monitor at home and seek medical attention if your symptoms worsen. . If you are having a medical emergency, call 911.   ADDITIONAL HEALTHCARE OPTIONS FOR PATIENTS  [facility] / e-Visit: 01/22/2020  [facility]: [facility]: [phone]                   [facility]: [phone]     It is flu season:   >>> Best ways to protect herself from the flu: Receive the yearly flu vaccine, practice good hand hygiene washing with soap and also using hand sanitizer when available, eat a nutritious meals, get adequate rest, hydrate appropriately   Please contact the office if your symptoms worsen or you have concerns that you are not improving.   Thank you for choosing [facility] for your healthcare, and for allowing [place] to partner with you on your healthcare journey. I am thankful to be able to provide care to you today.   Wyn Quaker FNP-C

## 2020-01-27 ENCOUNTER — Other Ambulatory Visit: Payer: Self-pay

## 2020-01-27 ENCOUNTER — Encounter: Payer: Self-pay | Admitting: Internal Medicine

## 2020-01-27 ENCOUNTER — Ambulatory Visit (INDEPENDENT_AMBULATORY_CARE_PROVIDER_SITE_OTHER): Payer: 59 | Admitting: Internal Medicine

## 2020-01-27 DIAGNOSIS — J45991 Cough variant asthma: Secondary | ICD-10-CM

## 2020-01-27 MED ORDER — FAMOTIDINE 20 MG PO TABS: ORAL_TABLET | ORAL | 11 refills | Status: AC

## 2020-01-27 NOTE — Progress Notes (Signed)
History of Present Illness  75 yobf  history of HTN on  Chronic ACEi and family history of asthma smoked only as teenager with new pattern of winter time tendency for colds to go down to chest x age 59 first started in [place] and moved to [place] around 2015 pattern had continued while on ACEi leading up to admit:   Admit date: 09/07/2017 Discharge date: 09/10/2017    Brief/Interim Summary: The patient is a 59 year old female with history of hypertension and family history of asthma who presented with a 2-week history of cough, wheezing, and shortness of breath. She had gone to her primary care doctor who prescribed her antibiotics but she did not improve so she presented to the emergency department. Chest x-ray was unremarkable. She was wheezing and started on steroids and nebulizer treatments. D-dimer was elevated however her CT angiogram chest was negative for pulmonary embolism. She was treated for acute bronchitis/asthma at the direction of pulmonary/critical care.   Additionally, she complained of some swelling of her lower extremities and some abdominal fullness.  She was also given some diuretics due to concerns for acute diastolic heart failure.  Her echocardiogram demonstrated grade 1 diastolic dysfunction with a dilated IVC and decreased compressibility of the IVC suggestive of elevated CVP.  At the time of discharge, she was able to ambulate in the halls maintaining oxygen saturations 97% and higher without significant dyspnea.  Discharge Diagnoses:  Principal Problem:   Acute bronchitis Active Problems:   Hypokalemia   Acute respiratory failure with hypoxia (HCC)   Essential hypertension  Acute respiratory failure with hypoxia likely secondary to new diagnosis of asthma, exacerbation triggered by acute bronchitis. She has a strong family history of asthma although she herself is never had wheezing. Respiratory viral panel was negative.  -Continue azithromycin to  complete course for pneumonia/bronchitis -  Prednisone burst -  Continue home glycopyrrolate-formoterol and prn albuterol  - Follow-up with pulmonology as an outpatient to schedule pulmonary function tests -  If cough and wheeze continue despite steroids and antibiotics, consider discontinuing her lisinopril  Nocturnal desaturations, intermittent down to the mid 80s usually but she had a few episodes where she would transiently go down to the high 70s with good waveform. -Recommend outpatient sleep study  Hypokalemia, likely secondary to patient's hydrochlorothiazide, improved with potassium supplementation  Hypertension, uncontrolled. Patient did not want to take amlodipine because she felt like her symptoms worsened. She wants to continue lisinopril. Continue IV hydralazine as needed.  Hydrochlorothiazide changed to Lasix for better diuresis.  Anticipate that her blood pressure may start to trend down after she stops her steroids.  Will defer further management to her PCP.  Acute diastolic heart failure -Echocardiogram:Grade 1 diastolic dysfunction, suggestion of elevated left ventricular filling pressures. IVC was dilated and blunted suggestive of elevated CVP. Also have some mildly elevated pulmonary arterial pressures consistent with mild pulmonary arterial hypertension. -Weight is 89.1 kg on the date of discharge -Stopped her HCTZ and started her on Lasix 40 mg once daily      NP eval 09/30/17 post hosp: FENO = 80 Prednisone taper; 10 mg tablets: 4 tabs x 2 days, 3 tabs x 2 days, 2 tabs x 2 days 1 tab x 2 days then stop. Please schecdule for PFT's  ACE level today Schedule for sleep study Omeprazole 40 mg once daily Sips of water instead of throat clearing Sugar free hard handy for throat soothing ( [employer] Ranchers) GERD Diet Avoid peppermint, Spearmint, Menthol,Chocolate  Delsym 5 cc's every 12 hours Continue Breo 1 puff once daily Rinse mouth after use Tessalon Perles  100 mg three times daily as needed for cough  Do not drive if sleepy.      10/16/2017 ov feeling better with losartan x 2 weeks in place of lisinopril but still some sensation of choking esp at hs and not taking ppi as rec.  Cough is dry/ raspy also worse at hs Doe  = MMRC2 = can't walk a nl pace on a flat grade s sob but does fine slow and flat  rec Plan A = Automatic = symbicort 80 Take 2 puffs first thing in am and then another 2 puffs about 12 hours later.  Prilosec 40 mg Take 30-60 min before first meal of the day and add pepcid 20 mg at bedtime until no cough at all for a week then stop it  work on inhaler technique:     Plan B = Backup Only use your albuterol (ventolin)as a rescue medication  GERD diet  Please schedule a follow up office visit in 4 weeks, sooner if needed  with all medications /inhalers/ solutions in hand so we can verify exactly what you are taking. This includes all medications from all doctors and over the counters     11/13/2017  f/u ov/Kerrie Latour re: chronic cough recurrent x 2016/ tends to flair in winter / did not bring meds as req Chief Complaint  Patient presents with  . Follow-up    She states she started coughing last night after being exposed to cold weather. She is unsure of sputum color. She states that she had an episode 2 wks ago where she woke up wheezing and had chest tightness- albuterol helped some. She is using her albuterol 1-2 x per wk on average.   Dyspnea:   MMRC1 = can walk nl pace, flat grade, can't hurry or go uphills or steps s sob   Cough: sporadic but  def worse when exp to cold air  Sleep: cough esp 3am  Most am's  rec Prednisone 10 mg take  4 each am x 2 days,   2 each am x 2 days,  1 each am x 2 days and stop  Change symbicort to 160 Take 2 puffs first thing in am and then another 2 puffs about 12 hours later.  - Spirometry 11/13/2017  FEV1 1.79 (78%)  Ratio 81 with slight curvature p am symb 80 x2    - Allergy profile 11/13/2017 >  Eos  0.5 /  IgE  81 RAST neg  - sinus CT 11/19/2017 > neg  - Singulair started 11/24/17    12/25/2017  f/u ov/Dontavion Noxon re: uacs vs asthma  Off acei since around the 1st of 2019 / did not bring meds as req  Chief Complaint  Patient presents with  . Follow-up    6 week f/u , feeling better, singular helping, still having a cough but not as bad , mornings are hard but less SOB during the day  Dyspnea:  Really not limited by doe  Cough:after wakes about an hour feels need to "get it out" min white mucus prod   Sleep: ok now  SABA use:  Rare despite "wheeze" that doesn't get better with saba  Typically after supper feels like she's wheezing right up until bedtime  rec Plan A = Automatic = symbicort 80 Take 2 puffs first thing in am and then another 2 puffs about 12 hours later.  Change the pepcid to where you take it after supper  GERD    Plan B = Backup Only use your albuterol as a rescue medication  Plan C = Crisis -  If still not doing better > Prednisone 10 mg take  4 each am x 2 days,   2 each am x 2 days,  1 each am x 2 days and stop   Please schedule a follow up office visit in 2 weeks, sooner if needed  with all medications /inhalers/ solutions in hand so we can verify exactly what you are taking. This includes all medications from all doctors and over the counters    01/11/2018  f/u ov/Isamu Trammel re: not completely  free of cough  Since 2016 but pattern has changed  Chief Complaint  Patient presents with  . Follow-up    Breathing is doing better and she has not needed to use her albuterol inhaler. Her cough has been prod with yellow sputum x 3-4 days.   Dyspnea:  Not limited by breathing from desired activities   Cough: day> noct  and not p supper - has changed Sleep: fine SABA use:  none Last dose of prednisone 01/11/2018 helped some/ confused between maint rx and prns  rec Stop losartan and start bisoprol 5 mg daily       11/18/2018 acute extended ov/Shade Kaley re: cough acute / maint rx  singulair  Chief Complaint  Patient presents with  . Acute Visit    Pt c/o wheezing for the past wk. She also c/o cough with thick, clear to yellow sputum. She has not had to use her rescue inhaler.    had been doing  fine s needing any albuterol then started with nasal drainage/ st/ cough/ wheeze /sob so started symb right away  And has not needed any abuterol since then  Dyspnea:  Better since restart symb but not taking pm dose  Cough: produce esp first thing in am Sleeping: on side /flat side/ 2 big pillows  SABA use: none 02: none  Rec At onset of any resp flare or need for albuterol more than a few times a week > symbicort 80 Take 2 puffs first thing in am and then another 2 puffs about 12 hours later until 100% better then taper  Work on inhaler technique:  zpak  Take delsym two tsp every 12 hours and supplement if needed with  tramadol 50 mg up to 1 every 4 hours to suppress the urge to cough   01/10/20 virtual NP You were seen today by Coral Ceo, NP  for:   1. Cough variant asthma  - predniSONE (DELTASONE) 10 MG tablet; 4 tabs for 2 days, then 3 tabs for 2 days, 2 tabs for 2 days, then 1 tab for 2 days, then stop  Dispense: 20 tablet; Refill: 0 - azithromycin (ZITHROMAX) 250 MG tablet; 500mg  (two tablets) today, then 250mg  (1 tablet) for the next 4 days  Dispense: 6 tablet; Refill: 0 - budesonide-formoterol (SYMBICORT) 80-4.5 MCG/ACT inhaler; Take 2 puffs first thing in am and then another 2 puffs about 12 hours later.  Dispense: 1 Inhaler; Refill: 12 - montelukast (SINGULAIR) 10 MG tablet; TAKE 1 TABLET BY MOUTH EVERYDAY AT BEDTIME  Dispense: 90 tablet; Refill: 5 - omeprazole (PRILOSEC) 40 MG capsule; TAKE 1 CAPSULE BY MOUTH EVERY DAY  Dispense: 90 capsule; Refill: 1   We recommend today:   Meds ordered this encounter  Medications  . predniSONE (DELTASONE) 10 MG tablet  Sig: 4 tabs for 2 days, then 3 tabs for 2 days, 2 tabs for 2 days, then 1 tab for 2 days, then stop     Dispense:  20 tablet    Refill:  0  . azithromycin (ZITHROMAX) 250 MG tablet    Sig: 500mg  (two tablets) today, then 250mg  (1 tablet) for the next 4 days    Dispense:  6 tablet    Refill:  0  . budesonide-formoterol (SYMBICORT) 80-4.5 MCG/ACT inhaler    Sig: Take 2 puffs first thing in am and then another 2 puffs about 12 hours later.    Dispense:  1 Inhaler    Refill:  12  . montelukast (SINGULAIR) 10 MG tablet    Sig: TAKE 1 TABLET BY MOUTH EVERYDAY AT BEDTIME    Dispense:  90 tablet    Refill:  5  . omeprazole (PRILOSEC) 40 MG capsule    Sig: TAKE 1 CAPSULE BY MOUTH EVERY DAY    Dispense:  90 capsule    Refill:  1     Virtual Visit via Telephone Note 01/27/2020   I connected with on 01/27/20 at  8:30AM EDT by telephone and verified that I am speaking with the correct person using two identifiers.   I discussed the limitations, risks, security and privacy concerns of performing an evaluation and management service by telephone and the availability of in person appointments. I also discussed with the patient that there may be a patient responsible charge related to this service. The patient expressed understanding and agreed to proceed.   History of Present Illness: Dyspnea:  Housework, steps ok , not limited  Cough: minimal  Sleeping: sometimes hears wheeze when supine but does not disturb sleep SABA use: not needing  02: none    No obvious day to day or daytime variability or assoc excess/ purulent sputum or mucus plugs or hemoptysis or cp or chest tightness, subjective wheeze or overt sinus or hb symptoms.    Also denies any obvious fluctuation of symptoms with weather or environmental changes or other aggravating or alleviating factors except as outlined above.   Meds reviewed/ med reconciliation completed          Observations/Objective: Non-conversational sob/ good voice texture / no spont cough    Assessment and Plan: See problem list for  active a/p's   Follow Up Instructions: See avs for instructions unique to this ov which includes revised/ updated med list     I discussed the assessment and treatment plan with the patient. The patient was provided an opportunity to ask questions and all were answered. The patient agreed with the plan and demonstrated an understanding of the instructions.   The patient was advised to call back or seek an in-person evaluation if the symptoms worsen or if the condition fails to improve as anticipated.  I provided 15 minutes of non-face-to-face time during this encounter.   Maxwell Marion, MD

## 2020-01-27 NOTE — Patient Instructions (Addendum)
Add pepcid 20 mg one after supper or one hour before bedtime to your regular/ routine/ maintenance medications to see if this helps your night time medications   GERD (REFLUX)  is an extremely common cause of respiratory symptoms just like yours , many times with no obvious heartburn at all.    It can be treated with medication, but also with lifestyle changes including elevation of the head of your bed (ideally with 6 -8 inch bed blocks under the headboard of your bed),  Smoking cessation, avoidance of late meals, excessive alcohol, and avoid fatty foods, chocolate, peppermint, colas, red wine, and acidic juices such as orange juice.  NO MINT OR MENTHOL PRODUCTS SO NO COUGH DROPS  USE SUGARLESS CANDY INSTEAD (Jolley ranchers or Stover's or Life Savers) or even ice chips will also do - the key is to swallow to prevent all throat clearing. NO OIL BASED VITAMINS - use powdered substitutes.  Avoid fish oil when coughing.   Please schedule a follow up office visit in 4 weeks, call sooner if needed with all medications /inhalers/ solutions in hand so we can verify exactly what you are taking. This includes all medications from all doctors and over the counters - PLEASE separate them into two bags:  the ones you take automatically, no matter what, vs the ones you take just when you feel you need them "BAG #2 is UP TO YOU"  - this will really help [place] help you take your medications more effectively.

## 2020-01-27 NOTE — Assessment & Plan Note (Signed)
Onset age 59's Off ACEi ? 09/2017  - PFT's 10/16/2017  wnl on BREO but still coughing  - 10/16/2017    changed breo to symbicort 80 2bid due to cough  - 11/13/2017  After extensive coaching inhaler device  effectiveness =    90% try increase symbicort 160 2bid due to noct cough on a trial basis - Spirometry 11/13/2017  FEV1 1.79 (78%)  Ratio 81 with slight curvature p am symb 80 x2    - Allergy profile 11/13/2017 >  Eos 0.5 /  IgE  81 RAST neg  - sinus CT 11/19/2017 > neg  - Singulair started 11/24/17 - Spirometry 12/25/2017 wnl on symb 160 2bid with audible wheeze, no flattening of f/v - FENO 12/25/2017  =   97 on singulair/ symb 80 2 bid with poor hfa       Improved since flare but still noct wheeze and doesn't appear to understand maint vs prns  rec Add pepcid 20 mg qhs  Each maintenance medication was reviewed in detail including most importantly the difference between maintenance and as needed and under what circumstances the prns are to be used.  Please see AVS for specific  Instructions which are unique to this visit and I personally typed out  which were reviewed in detail over the phone with the patient and a copy provided via MyCHart   Return in 4 weeks  To keep things simple, I have asked the patient to first separate medicines that are perceived as maintenance, that is to be taken daily "no matter what", from those medicines that are taken on only on an as-needed basis and I have given the patient examples of both, and then return to see me with all meds in hand using a trust but verify approach to confirm accurate Medication  Reconciliation The principal here is that until we are certain that the  patients are doing what we've asked, it makes no sense to ask them to do more.

## 2020-04-27 ENCOUNTER — Other Ambulatory Visit: Payer: Self-pay | Admitting: Internal Medicine

## 2021-02-10 ENCOUNTER — Emergency Department (HOSPITAL_BASED_OUTPATIENT_CLINIC_OR_DEPARTMENT_OTHER): Payer: No Typology Code available for payment source

## 2021-02-10 ENCOUNTER — Emergency Department (HOSPITAL_BASED_OUTPATIENT_CLINIC_OR_DEPARTMENT_OTHER)
Admission: EM | Admit: 2021-02-10 | Discharge: 2021-02-10 | Disposition: A | Discharge status: Home / Self Care | Payer: No Typology Code available for payment source | Attending: Emergency Medicine | Admitting: Emergency Medicine

## 2021-02-10 ENCOUNTER — Other Ambulatory Visit: Payer: Self-pay

## 2021-02-10 ENCOUNTER — Encounter (HOSPITAL_BASED_OUTPATIENT_CLINIC_OR_DEPARTMENT_OTHER): Payer: Self-pay | Admitting: Emergency Medicine

## 2021-02-10 DIAGNOSIS — Z87891 Personal history of nicotine dependence: Secondary | ICD-10-CM | POA: Insufficient documentation

## 2021-02-10 DIAGNOSIS — R112 Nausea with vomiting, unspecified: Secondary | ICD-10-CM | POA: Diagnosis not present

## 2021-02-10 DIAGNOSIS — J45909 Unspecified asthma, uncomplicated: Secondary | ICD-10-CM | POA: Diagnosis not present

## 2021-02-10 DIAGNOSIS — I10 Essential (primary) hypertension: Secondary | ICD-10-CM | POA: Diagnosis not present

## 2021-02-10 DIAGNOSIS — R109 Unspecified abdominal pain: Secondary | ICD-10-CM | POA: Insufficient documentation

## 2021-02-10 DIAGNOSIS — Z79899 Other long term (current) drug therapy: Secondary | ICD-10-CM | POA: Insufficient documentation

## 2021-02-10 LAB — URINALYSIS, ROUTINE W REFLEX MICROSCOPIC
Bilirubin Urine: NEGATIVE
Glucose, UA: NEGATIVE mg/dL
Ketones, ur: NEGATIVE mg/dL
Leukocytes,Ua: NEGATIVE
Nitrite: NEGATIVE
Protein, ur: NEGATIVE mg/dL
Specific Gravity, Urine: <1.005 — ABNORMAL LOW (ref 1.005–1.030)
pH: 6.5 (ref 5.0–8.0)

## 2021-02-10 LAB — COMPREHENSIVE METABOLIC PANEL
ALT: 16 U/L (ref 0–44)
AST: 24 U/L (ref 15–41)
Albumin: 4.5 g/dL (ref 3.5–5.0)
Alkaline Phosphatase: 67 U/L (ref 38–126)
Anion gap: 10 (ref 5–15)
BUN: 18 mg/dL (ref 6–20)
CO2: 24 mmol/L (ref 22–32)
Calcium: 9.2 mg/dL (ref 8.9–10.3)
Chloride: 106 mmol/L (ref 98–111)
Creatinine, Ser: 0.76 mg/dL (ref 0.44–1.00)
GFR, Estimated: >60 mL/min (ref >60)
Glucose, Bld: 120 mg/dL — ABNORMAL HIGH (ref 70–99)
Potassium: 3.5 mmol/L (ref 3.5–5.1)
Sodium: 140 mmol/L (ref 135–145)
Total Bilirubin: 0.2 mg/dL — ABNORMAL LOW (ref 0.3–1.2)
Total Protein: 8.5 g/dL — ABNORMAL HIGH (ref 6.5–8.1)

## 2021-02-10 LAB — CBC WITH DIFFERENTIAL/PLATELET
Abs Immature Granulocytes: 0.03 10*3/uL (ref 0.00–0.07)
Basophils Absolute: 0.1 10*3/uL (ref 0.0–0.1)
Basophils Relative: 1 %
Eosinophils Absolute: 0 10*3/uL (ref 0.0–0.5)
Eosinophils Relative: 0 %
HCT: 41.9 % (ref 36.0–46.0)
Hemoglobin: 13 g/dL (ref 12.0–15.0)
Immature Granulocytes: 0 %
Lymphocytes Relative: 13 %
Lymphs Abs: 1.1 10*3/uL (ref 0.7–4.0)
MCH: 28.6 pg (ref 26.0–34.0)
MCHC: 31 g/dL (ref 30.0–36.0)
MCV: 92.1 fL (ref 80.0–100.0)
Monocytes Absolute: 0.4 10*3/uL (ref 0.1–1.0)
Monocytes Relative: 5 %
Neutro Abs: 6.8 10*3/uL (ref 1.7–7.7)
Neutrophils Relative %: 81 %
Platelets: 313 10*3/uL (ref 150–400)
RBC: 4.55 MIL/uL (ref 3.87–5.11)
RDW: 13.2 % (ref 11.5–15.5)
WBC: 8.5 10*3/uL (ref 4.0–10.5)
nRBC: 0 % (ref 0.0–0.2)

## 2021-02-10 LAB — URINALYSIS, MICROSCOPIC (REFLEX)

## 2021-02-10 LAB — LIPASE, BLOOD: Lipase: 25 U/L (ref 11–51)

## 2021-02-10 MED ORDER — HYDROCODONE-ACETAMINOPHEN 5-325 MG PO TABS: 1.0000 | ORAL_TABLET | ORAL | 0 refills | Status: AC | PRN

## 2021-02-10 MED ORDER — TAMSULOSIN HCL 0.4 MG PO CAPS: 0.4000 mg | ORAL_CAPSULE | Freq: Every day | ORAL | 0 refills | Status: AC

## 2021-02-10 MED ORDER — SODIUM CHLORIDE 0.9 % IV BOLUS
1000.0000 mL | Freq: Once | INTRAVENOUS | Status: AC
  Administered 2021-02-10: 1000 mL via INTRAVENOUS

## 2021-02-10 MED ORDER — MORPHINE SULFATE (PF) 4 MG/ML IV SOLN
4.0000 mg | Freq: Once | INTRAVENOUS | Status: AC
  Administered 2021-02-10: 4 mg via INTRAVENOUS
  Filled 2021-02-10: qty 1

## 2021-02-10 MED ORDER — ONDANSETRON HCL 4 MG PO TABS: 4.0000 mg | ORAL_TABLET | Freq: Four times a day (QID) | ORAL | 0 refills | Status: AC

## 2021-02-10 MED ORDER — ONDANSETRON HCL 4 MG/2ML IJ SOLN
4.0000 mg | Freq: Once | INTRAMUSCULAR | Status: AC
  Administered 2021-02-10: 4 mg via INTRAVENOUS
  Filled 2021-02-10: qty 2

## 2021-02-10 NOTE — ED Provider Notes (Signed)
[facility] EMERGENCY DEPARTMENT Provider Note   CSN: [id number] Arrival date & time: 02/10/21  [id number]     History Chief Complaint  Patient presents with  . Flank Pain    Jillian Jefferson is a 60 y.o. female.  60 y.o female with a PMH of HTN, Asthma presents to the ED with a chief complaint of right flank pain of sudden onset 3 hours prior to arrival in the ED.  Described as sharp, constant with waxing and waning in intensity.  Reports there is no alleviating or exacerbating factors.  He has not taken anything for pain control at this time.  Does report no prior history of kidney stones.  Also endorses some nausea along with one episode of nonbloody, nonbilious emesis.  She has been able to void without any dysuria, hematuria or urinary complaints.  No fever, abdominal pain, shortness of breath or chest pain.    Flank Pain This is a new problem. The current episode started 3 to 5 hours ago. The problem occurs constantly. The problem has been gradually worsening. Pertinent negatives include no chest pain, no abdominal pain, no headaches and no shortness of breath.       Past Medical History:  Diagnosis Date  . Asthma    cold induced  . Hypertension     Patient Active Problem List   Diagnosis Date Noted  . Obesity (BMI 30-39.9) 03/19/2019  . Cough variant asthma  vs UACS  10/16/2017  . Hypertension with heart disease 09/08/2017    Past Surgical History:  Procedure Laterality Date  . MOUTH SURGERY       OB History   No obstetric history on file.     Family History  Problem Relation Age of Onset  . Hypertension Other   . Hypertension Mother   . Diabetes Mother   . Heart disease Mother   . Hypertension Father   . Diabetes Father   . Renal Disease Father   . Stroke Father     Social History   Tobacco Use  . Smoking status: Former Smoker    Packs/day: 0.30    Years: 3.00    Pack years: 0.90    Quit date: 1981    Years since quitting: 41.3  . Smokeless  tobacco: Never Used  Vaping Use  . Vaping Use: Never used  Substance Use Topics  . Alcohol use: Yes    Comment: occas  . Drug use: No    Home Medications Prior to Admission medications   Medication Sig Start Date End Date Taking? Authorizing Provider  HYDROcodone-acetaminophen (NORCO/VICODIN) 5-325 MG tablet Take 1 tablet by mouth every 4 (four) hours as needed for up to 3 days. 02/10/21 02/13/21 Yes Tysheem Accardo, PA-C  ondansetron (ZOFRAN) 4 MG tablet Take 1 tablet (4 mg total) by mouth every 6 (six) hours for 7 days. 02/10/21 02/17/21 Yes Tiesha Marich, Leonie DouglasJohana, PA-C  tamsulosin (FLOMAX) 0.4 MG CAPS capsule Take 1 capsule (0.4 mg total) by mouth daily for 7 days. 02/10/21 02/17/21 Yes Damere Brandenburg, Leonie DouglasJohana, PA-C  albuterol (VENTOLIN HFA) 108 (90 Base) MCG/ACT inhaler TAKE 2 PUFFS BY MOUTH EVERY 6 HOURS AS NEEDED FOR WHEEZE OR SHORTNESS OF BREATH 04/27/20   Nyoka CowdenWert, Michael B, MD  bisoprolol (ZEBETA) 5 MG tablet One twice daily Patient taking differently: daily.  08/05/18   Nyoka CowdenWert, Michael B, MD  budesonide-formoterol (SYMBICORT) 80-4.5 MCG/ACT inhaler Take 2 puffs first thing in am and then another 2 puffs about 12 hours later. 01/10/20   Cherre HugerMack,  Art Buff, NP  cetirizine (ZYRTEC) 10 MG tablet Take 10 mg by mouth daily as needed.     [provider]  cyclobenzaprine (FLEXERIL) 10 MG tablet Take 10 mg by mouth 3 (three) times daily as needed for muscle spasms.    [provider]  famotidine (PEPCID) 20 MG tablet One after supper 01/27/20   Nyoka Cowden, MD  furosemide (LASIX) 40 MG tablet Take 1 tablet (40 mg total) by mouth daily. 09/10/17 03/22/19  Renae Fickle, MD  ibuprofen (ADVIL,MOTRIN) 800 MG tablet Take 800 mg by mouth every 8 (eight) hours as needed for moderate pain.    [provider]  montelukast (SINGULAIR) 10 MG tablet TAKE 1 TABLET BY MOUTH EVERYDAY AT BEDTIME 01/10/20   Coral Ceo, NP  omeprazole (PRILOSEC) 40 MG capsule TAKE 1 CAPSULE BY MOUTH EVERY DAY 01/10/20   Coral Ceo, NP  potassium chloride (K-DUR) 10 MEQ tablet Take 10 mEq by mouth daily as needed.     [provider]  spironolactone (ALDACTONE) 50 MG tablet TAKE 1 TABLET BY MOUTH EVERY DAY 06/08/19   Yates Decamp, MD    Allergies    Patient has no known allergies.  Review of Systems   Review of Systems  Constitutional: Negative for fever.  HENT: Negative for sore throat.   Respiratory: Negative for shortness of breath.   Cardiovascular: Negative for chest pain.  Gastrointestinal: Positive for nausea and vomiting. Negative for abdominal pain and diarrhea.  Genitourinary: Positive for flank pain.  Musculoskeletal: Positive for back pain.  Neurological: Negative for headaches.  All other systems reviewed and are negative.   Physical Exam Updated Vital Signs BP (!) 170/85 (BP Location: Right Arm)   Pulse 71   Temp 98.3 F (36.8 C) (Oral)   Resp 15   Ht 5\' 5"  (1.651 m)   Wt 90.7 kg   SpO2 98%   BMI 33.28 kg/m   Physical Exam Vitals and nursing note reviewed.  Constitutional:      Appearance: Normal appearance. She is ill-appearing.  HENT:     Head: Normocephalic and atraumatic.     Mouth/Throat:     Mouth: Mucous membranes are moist.  Eyes:     Pupils: Pupils are equal, round, and reactive to light.  Cardiovascular:     Rate and Rhythm: Normal rate.  Pulmonary:     Effort: Pulmonary effort is normal.     Breath sounds: No wheezing, rhonchi or rales.  Abdominal:     General: Abdomen is flat.     Tenderness: There is abdominal tenderness. There is right CVA tenderness. There is no left CVA tenderness.  Musculoskeletal:     Cervical back: Normal range of motion and neck supple.  Skin:    General: Skin is warm and dry.  Neurological:     Mental Status: She is alert.     ED Results / Procedures / Treatments   Labs (all labs ordered are listed, but only abnormal results are displayed) Labs Reviewed  COMPREHENSIVE METABOLIC PANEL - Abnormal; Notable for the following  components:      Result Value   Glucose, Bld 120 (*)    Total Protein 8.5 (*)    Total Bilirubin 0.2 (*)    All other components within normal limits  URINALYSIS, ROUTINE W REFLEX MICROSCOPIC - Abnormal; Notable for the following components:   Color, Urine STRAW (*)    Specific Gravity, Urine <1.005 (*)    Hgb urine dipstick  SMALL (*)    All other components within normal limits  URINALYSIS, MICROSCOPIC (REFLEX) - Abnormal; Notable for the following components:   Bacteria, UA FEW (*)    All other components within normal limits  CBC WITH DIFFERENTIAL/PLATELET  LIPASE, BLOOD    EKG None  Radiology CT Renal Stone Study  Result Date: 02/10/2021 CLINICAL DATA:  Pt c/o right sided flank pain with N/V onset today. Pt denies urinary symptoms. Pt reports last week experienced B/L back pain with nausea. EXAM: CT ABDOMEN AND PELVIS WITHOUT CONTRAST TECHNIQUE: Multidetector CT imaging of the abdomen and pelvis was performed following the standard protocol without IV contrast. COMPARISON:  None. FINDINGS: Lower chest: No acute abnormality. Evaluation of the abdominal viscera limited by the lack of IV contrast. Hepatobiliary: No focal liver abnormality is seen. Normal gallbladder. Pancreas: Unremarkable. No surrounding inflammatory changes. Spleen: Normal in size without focal abnormality. Adrenals/Urinary Tract: Adrenal glands are unremarkable. The right kidney appears mildly edematous. There is mild perinephric stranding. Mild diffuse ureterectasis. There is a punctate stone at the right UVJ (series 5, image 48). No left renal calculi or hydronephrosis. No mass lesion. Urinary bladder is unremarkable. Stomach/Bowel: Stomach is within normal limits. Appendix appears normal. No evidence of bowel wall thickening, distention, or inflammatory changes. Vascular/Lymphatic: No enlarged abdominal or pelvic lymph nodes. Reproductive: Probable fibroid in the anterior uterus. No adnexal mass. Other: No  abdominopelvic ascites. Musculoskeletal: There is a low-density lesion in the upper thigh musculature measuring 3.5 x 2.6 cm (series 5, image 73). No acute osseous finding. Degenerative disc disease in the lower lumbar spine. IMPRESSION: 1. There is a punctate stone at the right UVJ, associated with mild right ureterectasis and mild right perinephric stranding. No significant hydronephrosis. 2. There is a low-density lesion in the upper thigh musculature measuring 3.5 x 2.6 cm. This is of uncertain etiology but differential includes ganglion nerve sheath tumor or myxoma. Further evaluation with outpatient MRI of the thigh with and without contrast is recommended. 3. Probable fibroid in the uterus. Electronically Signed   By: Emmaline Kluver M.D.   On: 02/10/2021 13:49    Procedures Procedures   Medications Ordered in ED Medications  ondansetron (ZOFRAN) injection 4 mg (4 mg Intravenous Given 02/10/21 1245)  morphine 4 MG/ML injection 4 mg (4 mg Intravenous Given 02/10/21 1246)  sodium chloride 0.9 % bolus 1,000 mL (0 mLs Intravenous Stopped 02/10/21 1407)    ED Course  I have reviewed the triage vital signs and the nursing notes.  Pertinent labs & imaging results that were available during my care of the patient were reviewed by me and considered in my medical decision making (see chart for details).    MDM Rules/Calculators/A&P    Patient presents to the ED with a chief complaint of right flank pain of sudden onset this morning prior to arrival into the ED.  Does report the pain was constant, radiating to the right lower quadrant.  Did not take any medication for improvement of her symptoms.  Does not have any history of kidney stones in the past.  Has not had any fevers, does report some nausea along with vomiting prior to arrival in the ED.  In the ED with stable vital signs, blood pressure slightly elevated, no tachycardia or hypoxia, she is afebrile.  Primary evaluation patient appears  uncomfortable, she is bent over while in the wheelchair trying to refrain from vomiting.  After placing patient in bed, examination with lungs clear to auscultation, right CVA  tenderness present.  Abdomen is soft, nontender to palpation.  Chest without any tenderness to palpation, moves all upper and lower extremities. Differential diagnosis included but not limited to renal colic, appendicitis, enteritis.    Interpretation of her blood work reveal a CBC without any leukocytosis, no signs of anemia.  Lipase levels within normal limits.  CMP without any electrolyte derangement, leukosis 120, no prior history of diabetes.  Creatinine function is within normal limits.  LFTs are unremarkable.  Given a bolus, morphine, Zofran to help with symptomatic treatment.  CT Renal Stone showed:  1. There is a punctate stone at the right UVJ, associated with mild  right ureterectasis and mild right perinephric stranding. No  significant hydronephrosis.  2. There is a low-density lesion in the upper thigh musculature  measuring 3.5 x 2.6 cm. This is of uncertain etiology but  differential includes ganglion nerve sheath tumor or myxoma. Further  evaluation with outpatient MRI of the thigh with and without  contrast is recommended.  3. Probable fibroid in the uterus.     These results were discussed at length with patient, she was also provided a copy of her CT report.  She does report improvement in her symptoms after fluids and Zofran.  We did discuss going home with a prescription of Flomax, pain medication, Zofran to help with her symptoms.  We are currently pending her UA.  3:10 PM UA with small amount of hemoglobin, few bacteria but no nitrites or leukocytes.  These results were given to patient.  She agreed with symptomatic treatment at home.  Patient remained stable.  Return precautions discussed at length.   Portions of this note were generated with Scientist, clinical (histocompatibility and immunogenetics). Dictation errors may occur  despite best attempts at proofreading.  Final Clinical Impression(s) / ED Diagnoses Final diagnoses:  Right flank pain    Rx / DC Orders ED Discharge Orders         Ordered    ondansetron (ZOFRAN) 4 MG tablet  Every 6 hours        02/10/21 1435    tamsulosin (FLOMAX) 0.4 MG CAPS capsule  Daily        02/10/21 1435    HYDROcodone-acetaminophen (NORCO/VICODIN) 5-325 MG tablet  Every 4 hours PRN        02/10/21 1435           Claude Manges, PA-C 02/10/21 1511    Little, Ambrose Finland, MD 02/10/21 1512

## 2021-02-10 NOTE — Discharge Instructions (Addendum)
I have provided a prescription to help with nausea, please take 1 tablet as needed.  I have also provided prescription for pain, please take this medication for severe pain.  Please be aware this medication can make you drowsy, do not drink alcohol or drive while taking this medication.  The third medication is Flomax, this should help with passing stone, please take this daily for the next week.  Follow up with your primary care physician.

## 2021-02-10 NOTE — ED Triage Notes (Signed)
Pt c/o right sided flank pain with N/V onset today. Pt denies urinary symptoms. Pt reports last week experienced B/L back pain with nausea.

## 2021-02-10 NOTE — ED Notes (Signed)
Attempted IV access x 2 w/o success. 

## 2021-02-18 ENCOUNTER — Other Ambulatory Visit: Payer: Self-pay | Admitting: Internal Medicine

## 2021-02-18 DIAGNOSIS — R2241 Localized swelling, mass and lump, right lower limb: Secondary | ICD-10-CM

## 2021-03-05 ENCOUNTER — Other Ambulatory Visit: Payer: No Typology Code available for payment source

## 2021-03-10 ENCOUNTER — Ambulatory Visit
Admission: RE | Admit: 2021-03-10 | Discharge: 2021-03-10 | Disposition: A | Discharge status: Home / Self Care | Payer: No Typology Code available for payment source | Source: Ambulatory Visit | Attending: Internal Medicine | Admitting: Internal Medicine

## 2021-03-10 ENCOUNTER — Other Ambulatory Visit: Payer: Self-pay | Admitting: Internal Medicine

## 2021-03-10 ENCOUNTER — Other Ambulatory Visit: Payer: Self-pay

## 2021-03-10 DIAGNOSIS — R2241 Localized swelling, mass and lump, right lower limb: Secondary | ICD-10-CM

## 2021-03-10 DIAGNOSIS — R2242 Localized swelling, mass and lump, left lower limb: Secondary | ICD-10-CM

## 2021-03-10 MED ORDER — GADOBENATE DIMEGLUMINE 529 MG/ML IV SOLN
19.0000 mL | Freq: Once | INTRAVENOUS | Status: AC | PRN
  Administered 2021-03-10: 19 mL via INTRAVENOUS

## 2021-04-09 ENCOUNTER — Other Ambulatory Visit: Payer: Self-pay

## 2021-04-09 ENCOUNTER — Ambulatory Visit (INDEPENDENT_AMBULATORY_CARE_PROVIDER_SITE_OTHER): Payer: 59

## 2021-04-09 ENCOUNTER — Encounter: Payer: Self-pay | Admitting: Orthopaedic Surgery

## 2021-04-09 ENCOUNTER — Ambulatory Visit (INDEPENDENT_AMBULATORY_CARE_PROVIDER_SITE_OTHER): Payer: 59 | Admitting: Orthopaedic Surgery

## 2021-04-09 DIAGNOSIS — R2242 Localized swelling, mass and lump, left lower limb: Secondary | ICD-10-CM | POA: Diagnosis not present

## 2021-04-09 DIAGNOSIS — G8929 Other chronic pain: Secondary | ICD-10-CM | POA: Diagnosis not present

## 2021-04-09 DIAGNOSIS — M25561 Pain in right knee: Secondary | ICD-10-CM

## 2021-04-09 MED ORDER — MELOXICAM 7.5 MG PO TABS: 7.5000 mg | ORAL_TABLET | Freq: Two times a day (BID) | ORAL | 2 refills | Status: DC | PRN

## 2021-04-09 NOTE — Progress Notes (Signed)
Office Visit Note   Patient: Jillian Jefferson           Date of Birth: 06/24/1961           MRN: [id number] Visit Date: 04/09/2021              Requested by: Fatima Sanger, FNP [address] [place],  [place] [address] PCP: Fatima Sanger, FNP   Assessment & Plan: Visit Diagnoses:  1. Chronic pain of right knee   2. Mass of left thigh     Plan: Based on findings for the right knee treatment options were discussed but symptoms are relatively mild therefore I recommended Voltaren gel, home exercises, Mobic as a first-line treatment.  For the left thigh mass we will make a referral to [facility] for orthopedic oncology to evaluate.  Follow-up as needed.  Follow-Up Instructions: Return if symptoms worsen or fail to improve.   Orders:  Orders Placed This Encounter  Procedures   XR KNEE 3 VIEW RIGHT   Meds ordered this encounter  Medications   meloxicam (MOBIC) 7.5 MG tablet    Sig: Take 1 tablet (7.5 mg total) by mouth 2 (two) times daily as needed for pain.    Dispense:  30 tablet    Refill:  2      Procedures: No procedures performed   Clinical Data: No additional findings.   Subjective: Chief Complaint  Patient presents with   Left Leg - Pain   Right Knee - Pain    Jillian Jefferson is a 60 year old female who is here for evaluation of chronic right knee pain and incidental finding of left thigh mass.  In terms of the right knee she has had pain on and off for couple months without any injuries.  No prior surgeries.  She has not had any injections or physical therapy.  She reports pain inside the knee without any consistent patterns.  In terms of the left thigh mass this was incidentally found on a CT scan for a kidney stone.  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  She reports no symptoms regarding this.  Denies any constitutional symptoms.   Review of Systems  Constitutional: Negative.    HENT: Negative.    Eyes: Negative.   Respiratory: Negative.    Cardiovascular: Negative.   Endocrine: Negative.   Musculoskeletal: Negative.   Neurological: Negative.   Hematological: Negative.   Psychiatric/Behavioral: Negative.    All other systems reviewed and are negative.   Objective: Vital Signs: There were no vitals taken for this visit.  Physical Exam Vitals and nursing note reviewed.  Constitutional:      Appearance: She is well-developed.  HENT:     Head: Normocephalic and atraumatic.  Pulmonary:     Effort: Pulmonary effort is normal.  Abdominal:     Palpations: Abdomen is soft.  Musculoskeletal:     Cervical back: Neck supple.  Skin:    General: Skin is warm.     Capillary Refill: Capillary refill takes less than 2 seconds.  Neurological:     Mental Status: She is alert and oriented to person, place, and time.  Psychiatric:        Behavior: Behavior normal.        Thought Content: Thought content normal.        Judgment: Judgment normal.    Ortho Exam Left thigh exam is completely benign.  There is no palpable masses or  overlying skin changes.  No deficits to motor or sensory function. Right knee shows no joint effusion.  No joint line tenderness or patellofemoral crepitus. Specialty Comments:  No specialty comments available.  Imaging: XR KNEE 3 VIEW RIGHT  Result Date: 04/09/2021 No acute or structural abnormalities    PMFS History: Patient Active Problem List   Diagnosis Date Noted   Obesity (BMI 30-39.9) 03/19/2019   Cough variant asthma  vs UACS  10/16/2017   Hypertension with heart disease 09/08/2017   Past Medical History:  Diagnosis Date   Asthma    cold induced   Hypertension     Family History  Problem Relation Age of Onset   Hypertension Other    Hypertension Mother    Diabetes Mother    Heart disease Mother    Hypertension Father    Diabetes Father    Renal Disease Father    Stroke Father     Past Surgical History:   Procedure Laterality Date   MOUTH SURGERY     Social History   Occupational History   Not on file  Tobacco Use   Smoking status: Former    Packs/day: 0.30    Years: 3.00    Pack years: 0.90    Types: Cigarettes    Quit date: 1981    Years since quitting: 41.5   Smokeless tobacco: Never  Vaping Use   Vaping Use: Never used  Substance and Sexual Activity   Alcohol use: Yes    Comment: occas   Drug use: No   Sexual activity: Not on file

## 2021-05-21 DIAGNOSIS — D4989 Neoplasm of unspecified behavior of other specified sites: Secondary | ICD-10-CM | POA: Insufficient documentation

## 2021-08-29 ENCOUNTER — Encounter: Payer: Self-pay | Admitting: Cardiology

## 2021-08-29 ENCOUNTER — Ambulatory Visit: Payer: 59 | Admitting: Cardiology

## 2021-08-29 ENCOUNTER — Other Ambulatory Visit: Payer: Self-pay

## 2021-08-29 VITALS — BP 154/78 | HR 58 | Resp 16 | Ht 65.0 in | Wt 205.6 lb

## 2021-08-29 DIAGNOSIS — R002 Palpitations: Secondary | ICD-10-CM

## 2021-08-29 DIAGNOSIS — E6609 Other obesity due to excess calories: Secondary | ICD-10-CM

## 2021-08-29 DIAGNOSIS — Z87891 Personal history of nicotine dependence: Secondary | ICD-10-CM

## 2021-08-29 DIAGNOSIS — I340 Nonrheumatic mitral (valve) insufficiency: Secondary | ICD-10-CM

## 2021-08-29 DIAGNOSIS — I10 Essential (primary) hypertension: Secondary | ICD-10-CM

## 2021-08-29 DIAGNOSIS — Z8249 Family history of ischemic heart disease and other diseases of the circulatory system: Secondary | ICD-10-CM

## 2021-08-29 MED ORDER — BISOPROLOL FUMARATE 5 MG PO TABS: 5.0000 mg | ORAL_TABLET | Freq: Every day | ORAL | 0 refills | Status: AC

## 2021-08-29 NOTE — Progress Notes (Signed)
Date:  08/29/2021   ID:  Jillian Jefferson, DOB [birth year], MRN [id number]  PCP:  Holland Commons, FNP  Cardiologist:  Rex Kras, DO, [facility] (established care 08/29/2021) Former Cardiology Providers: Dr. Vear Clock  Chief Complaint  Patient presents with   Palpitations   New Patient (Initial Visit)    HPI  Jillian Jefferson is a 60 y.o. African-American female who presents to the office with a chief complaint of " palpitations." Patient's past medical history and cardiovascular risk factors include: Hypertension, moderate mitral regurgitation, former smoker, obesity due to excess calories, family history of heart disease (mother had a CABG in her 61s).  Last seen by Dr. Vear Clock 02/2019 now presents for follow-up for evaluation of palpitations.  Patient states that she is been having palpitations for the last 2 months without a known root cause.  The symptoms happen on a daily basis, lasting a few seconds, no improving or worsening factors, usually self-limited, no near-syncope or syncopal event.  Patient was on bisoprolol 5 mg p.o. daily for blood pressure management but due to symptoms of palpitations PCP increased it to twice daily.  Still no significant change in her palpitations.  She recently had labs in November 2022 at her PCPs office which were independently reviewed available in [place].  TSH within normal limits electrolytes within normal limits.  She denies a consumption of caffeinated beverages, sodas, energy drinks, coffee, herbal supplements, weight loss supplements, or stimulants.  She consumes red wine very rarely.  FUNCTIONAL STATUS: No structured exercise program or daily routine.   ALLERGIES: No Known Allergies  MEDICATION LIST PRIOR TO VISIT: Current Meds  Medication Sig   albuterol (VENTOLIN HFA) 108 (90 Base) MCG/ACT inhaler TAKE 2 PUFFS BY MOUTH EVERY 6 HOURS AS NEEDED FOR WHEEZE OR SHORTNESS OF BREATH   budesonide-formoterol (SYMBICORT) 80-4.5  MCG/ACT inhaler Take 2 puffs first thing in am and then another 2 puffs about 12 hours later.   cetirizine (ZYRTEC) 10 MG tablet Take 10 mg by mouth daily as needed.    cyclobenzaprine (FLEXERIL) 10 MG tablet Take 10 mg by mouth 3 (three) times daily as needed for muscle spasms.   famotidine (PEPCID) 20 MG tablet One after supper   furosemide (LASIX) 40 MG tablet Take 1 tablet (40 mg total) by mouth daily. (Patient taking differently: Take 40 mg by mouth as needed.)   ibuprofen (ADVIL,MOTRIN) 800 MG tablet Take 800 mg by mouth every 8 (eight) hours as needed for moderate pain.   montelukast (SINGULAIR) 10 MG tablet TAKE 1 TABLET BY MOUTH EVERYDAY AT BEDTIME (Patient taking differently: Take 10 mg by mouth as needed. TAKE 1 TABLET BY MOUTH EVERYDAY AT BEDTIME)   omeprazole (PRILOSEC) 40 MG capsule TAKE 1 CAPSULE BY MOUTH EVERY DAY (Patient taking differently: 40 mg as needed. TAKE 1 CAPSULE BY MOUTH EVERY DAY)   potassium chloride (K-DUR) 10 MEQ tablet Take 10 mEq by mouth daily as needed.    spironolactone (ALDACTONE) 50 MG tablet TAKE 1 TABLET BY MOUTH EVERY DAY   [DISCONTINUED] bisoprolol (ZEBETA) 5 MG tablet One twice daily (Patient taking differently: daily.)     PAST MEDICAL HISTORY: Past Medical History:  Diagnosis Date   Asthma    cold induced   Hypertension     PAST SURGICAL HISTORY: Past Surgical History:  Procedure Laterality Date   MOUTH SURGERY      FAMILY HISTORY: The patient family history includes Diabetes in her father and mother; Heart disease in her mother;  Hypertension in her father, mother, and another family member; Renal Disease in her father; Stroke in her father.  SOCIAL HISTORY:  The patient  reports that she quit smoking about 41 years ago. Her smoking use included cigarettes. She has a 0.90 pack-year smoking history. She has never used smokeless tobacco. She reports current alcohol use. She reports that she does not use drugs.  REVIEW OF SYSTEMS: Review of  Systems  Constitutional: Negative for chills and fever.  HENT:  Negative for hoarse voice and nosebleeds.   Eyes:  Negative for discharge, double vision and pain.  Cardiovascular:  Positive for palpitations. Negative for chest pain, claudication, dyspnea on exertion, leg swelling, near-syncope, orthopnea, paroxysmal nocturnal dyspnea and syncope.  Respiratory:  Negative for hemoptysis and shortness of breath.   Musculoskeletal:  Negative for muscle cramps and myalgias.  Gastrointestinal:  Negative for abdominal pain, constipation, diarrhea, hematemesis, hematochezia, melena, nausea and vomiting.  Neurological:  Negative for dizziness and light-headedness.   PHYSICAL EXAM: Vitals with BMI 08/29/2021 02/10/2021 02/10/2021  Height $Remov'5\' 5"'FAfpvA$  - -  Weight 205 lbs 10 oz - -  BMI 24.40 - -  Systolic 102 725 366  Diastolic 78 68 85  Pulse 58 72 71    CONSTITUTIONAL: Well-developed and well-nourished. No acute distress.  SKIN: Skin is warm and dry. No rash noted. No cyanosis. No pallor. No jaundice HEAD: Normocephalic and atraumatic.  EYES: No scleral icterus MOUTH/THROAT: Moist oral membranes.  NECK: No JVD present. No thyromegaly noted. No carotid bruits  LYMPHATIC: No visible cervical adenopathy.  CHEST Normal respiratory effort. No intercostal retractions  LUNGS: Clear to auscultation bilaterally.  No stridor. No wheezes. No rales.  CARDIOVASCULAR: Regular rate and rhythm, positive S1-S2, no murmurs rubs or gallops appreciated. ABDOMINAL: Obese, soft, nontender, nondistended, positive bowel sounds in all 4 quadrants, no apparent ascites.  EXTREMITIES: No peripheral edema, warm to touch, 2+ DP and PT pulses HEMATOLOGIC: No significant bruising NEUROLOGIC: Oriented to person, place, and time. Nonfocal. Normal muscle tone.  PSYCHIATRIC: Normal mood and affect. Normal behavior. Cooperative  CARDIAC DATABASE: EKG: 08/29/2021: Sinus  Bradycardia, 57bpm, nonspecific T-abnormality.    Echocardiogram: 10/06/2019: Left ventricle cavity is normal in size and thickness. Normal LV systolic function with EF 64%. Normal global wall motion. Normal diastolic filling pattern. Moderate (Grade II) mitral regurgitation. Mild tricuspid regurgitation. Estimated pulmonary artery systolic pressure is 25 mmHg.   Stress Testing: Lexiscan sestamibi stress test 10/19/2017: 1. Resting EKG demonstrates normal sinus rhythm, left axis deviation, poor R-wave progression. Stress EKG is non-diagnostic for ischemia as a pharmacologic stress test. 2. Myocardial perfusion imaging is normal. Overall left ventricular systolic function was normal without regional wall motion abnormalities. The left ventricular ejection fraction was 68%. This is a low risk study.  Heart Catheterization: None  LABORATORY DATA: CBC Latest Ref Rng & Units 02/10/2021 11/13/2017 09/10/2017  WBC 4.0 - 10.5 K/uL 8.5 7.0 16.8(H)  Hemoglobin 12.0 - 15.0 g/dL 13.0 11.6(L) 10.6(L)  Hematocrit 36.0 - 46.0 % 41.9 35.6(L) 33.4(L)  Platelets 150 - 400 K/uL 313 341.0 306    CMP Latest Ref Rng & Units 02/10/2021 05/05/2018 09/10/2017  Glucose 70 - 99 mg/dL 120(H) 94 110(H)  BUN 6 - 20 mg/dL 18 18 23(H)  Creatinine 0.44 - 1.00 mg/dL 0.76 0.84 0.81  Sodium 135 - 145 mmol/L 140 139 136  Potassium 3.5 - 5.1 mmol/L 3.5 3.9 3.6  Chloride 98 - 111 mmol/L 106 102 97(L)  CO2 22 - 32 mmol/L 24 32 30  Calcium 8.9 - 10.3 mg/dL 9.2 10.0 8.9  Total Protein 6.5 - 8.1 g/dL 8.5(H) - -  Total Bilirubin 0.3 - 1.2 mg/dL 0.2(L) - -  Alkaline Phos 38 - 126 U/L 67 - -  AST 15 - 41 U/L 24 - -  ALT 0 - 44 U/L 16 - -    Lipid Panel  No results found for: CHOL, TRIG, HDL, CHOLHDL, VLDL, LDLCALC, LDLDIRECT, LABVLDL  No components found for: NTPROBNP No results for input(s): PROBNP in the last 8760 hours. No results for input(s): TSH in the last 8760 hours.  BMP Recent Labs    02/10/21 1238  NA 140  K 3.5  CL 106  CO2 24  GLUCOSE 120*  BUN 18   CREATININE 0.76  CALCIUM 9.2  GFRNONAA >60    HEMOGLOBIN A1C No results found for: HGBA1C, MPG  External Labs: Collected: 07/30/2021 performed by PCP. Sodium 139, potassium 4.7, chloride 105, bicarb 26, BUN 11, creatinine 0.72. AST 15, ALT 10, alkaline phosphatase 77. eGFR >60. Magnesium 2.1. TSH 1.6. Hemoglobin 11.9 g/dL, hematocrit 37.8%   IMPRESSION:    ICD-10-CM   1. Palpitations  R00.2 EKG 12-Lead    LONG TERM MONITOR (3-14 DAYS)    bisoprolol (ZEBETA) 5 MG tablet    2. Benign hypertension  I10     3. Nonrheumatic mitral valve regurgitation  I34.0 PCV ECHOCARDIOGRAM COMPLETE    4. Former smoker  Z87.891     5. Family history of heart disease  Z82.49     6. Class 1 obesity due to excess calories without serious comorbidity with body mass index (BMI) of 34.0 to 34.9 in adult  E66.09    Z68.34        RECOMMENDATIONS: Jillian Jefferson is a 60 y.o. African-American female whose past medical history and cardiac risk factors include: Hypertension, moderate mitral regurgitation, former smoker, obesity due to excess calories, family history of heart disease (mother had a CABG in her 22s).  Palpitations No identifiable reversible cause. TSH within normal limits, hemoglobin within acceptable range Currently on bisoprolol 10 mg p.o. daily. No near-syncope or syncopal events Recommended to reducing the bisoprolol to 5 mg p.o. daily for 1 week followed by 7-day extended Holter monitor to evaluate for underlying dysrhythmias. Patient is asked to resume her dose of bisoprolol 10 mg p.o. daily once the monitor has been mailed back to the company.  can She is asked to seek medical if she has episodes of syncope in the interim.  Benign hypertension Recommend a goal systolic blood pressure around 130 mmHg.  This will also help improve the underlying mitral regurgitation.   Reemphasized the importance of a low-salt diet. Will defer to PCP at this time unless if I am asked to  intervene.  Nonrheumatic mitral valve regurgitation Repeat echocardiogram in January 2023 as a 2-year follow-up to reevaluate severity of MR. Educated on importance of blood pressure management.  Class 1 obesity due to excess calories without serious comorbidity with body mass index (BMI) of 34.0 to 34.9 in adult Body mass index is 34.21 kg/m. I reviewed with the patient the importance of diet, regular physical activity/exercise, weight loss.   Patient is educated on increasing physical activity gradually as tolerated.  With the goal of moderate intensity exercise for 30 minutes a day 5 days a week.  FINAL MEDICATION LIST END OF ENCOUNTER: Meds ordered this encounter  Medications   bisoprolol (ZEBETA) 5 MG tablet    Sig: Take 1 tablet (5  mg total) by mouth daily.    Dispense:  90 tablet    Refill:  0    Medications Discontinued During This Encounter  Medication Reason   meloxicam (MOBIC) 7.5 MG tablet    bisoprolol (ZEBETA) 5 MG tablet      Current Outpatient Medications:    albuterol (VENTOLIN HFA) 108 (90 Base) MCG/ACT inhaler, TAKE 2 PUFFS BY MOUTH EVERY 6 HOURS AS NEEDED FOR WHEEZE OR SHORTNESS OF BREATH, Disp: 18 g, Rfl: 3   budesonide-formoterol (SYMBICORT) 80-4.5 MCG/ACT inhaler, Take 2 puffs first thing in am and then another 2 puffs about 12 hours later., Disp: 1 Inhaler, Rfl: 12   cetirizine (ZYRTEC) 10 MG tablet, Take 10 mg by mouth daily as needed. , Disp: , Rfl:    cyclobenzaprine (FLEXERIL) 10 MG tablet, Take 10 mg by mouth 3 (three) times daily as needed for muscle spasms., Disp: , Rfl:    famotidine (PEPCID) 20 MG tablet, One after supper, Disp: 30 tablet, Rfl: 11   furosemide (LASIX) 40 MG tablet, Take 1 tablet (40 mg total) by mouth daily. (Patient taking differently: Take 40 mg by mouth as needed.), Disp: 30 tablet, Rfl: 0   ibuprofen (ADVIL,MOTRIN) 800 MG tablet, Take 800 mg by mouth every 8 (eight) hours as needed for moderate pain., Disp: , Rfl:    montelukast  (SINGULAIR) 10 MG tablet, TAKE 1 TABLET BY MOUTH EVERYDAY AT BEDTIME (Patient taking differently: Take 10 mg by mouth as needed. TAKE 1 TABLET BY MOUTH EVERYDAY AT BEDTIME), Disp: 90 tablet, Rfl: 5   omeprazole (PRILOSEC) 40 MG capsule, TAKE 1 CAPSULE BY MOUTH EVERY DAY (Patient taking differently: 40 mg as needed. TAKE 1 CAPSULE BY MOUTH EVERY DAY), Disp: 90 capsule, Rfl: 1   potassium chloride (K-DUR) 10 MEQ tablet, Take 10 mEq by mouth daily as needed. , Disp: , Rfl:    spironolactone (ALDACTONE) 50 MG tablet, TAKE 1 TABLET BY MOUTH EVERY DAY, Disp: 90 tablet, Rfl: 0   bisoprolol (ZEBETA) 5 MG tablet, Take 1 tablet (5 mg total) by mouth daily., Disp: 90 tablet, Rfl: 0  Orders Placed This Encounter  Procedures   LONG TERM MONITOR (3-14 DAYS)   EKG 12-Lead   PCV ECHOCARDIOGRAM COMPLETE    There are no Patient Instructions on file for this visit.   --Continue cardiac medications as reconciled in final medication list. --Return in about 8 weeks (around 10/24/2021) for Follow up, Palpitations, Review test results. Or sooner if needed. --Continue follow-up with your primary care physician regarding the management of your other chronic comorbid conditions.  Patient's questions and concerns were addressed to her satisfaction. She voices understanding of the instructions provided during this encounter.   This note was created using a voice recognition software as a result there may be grammatical errors inadvertently enclosed that do not reflect the nature of this encounter. Every attempt is made to correct such errors.  Rex Kras, [place], [facility]  Pager: [phone] Office: [phone]

## 2021-09-05 ENCOUNTER — Other Ambulatory Visit: Payer: Self-pay

## 2021-09-05 ENCOUNTER — Inpatient Hospital Stay: Payer: 59

## 2021-09-05 ENCOUNTER — Ambulatory Visit: Payer: 59

## 2021-09-05 DIAGNOSIS — R002 Palpitations: Secondary | ICD-10-CM

## 2021-09-05 DIAGNOSIS — I340 Nonrheumatic mitral (valve) insufficiency: Secondary | ICD-10-CM

## 2021-09-11 ENCOUNTER — Inpatient Hospital Stay: Payer: 59

## 2021-09-11 ENCOUNTER — Other Ambulatory Visit: Payer: Self-pay

## 2021-09-16 ENCOUNTER — Inpatient Hospital Stay: Payer: 59

## 2021-10-24 ENCOUNTER — Other Ambulatory Visit: Payer: Self-pay

## 2021-10-24 ENCOUNTER — Encounter: Payer: Self-pay | Admitting: Cardiology

## 2021-10-24 ENCOUNTER — Ambulatory Visit: Payer: 59 | Admitting: Cardiology

## 2021-10-24 VITALS — BP 140/83 | HR 68 | Temp 98.0°F | Resp 16 | Ht 65.0 in | Wt 203.2 lb

## 2021-10-24 DIAGNOSIS — E6609 Other obesity due to excess calories: Secondary | ICD-10-CM

## 2021-10-24 DIAGNOSIS — Z87891 Personal history of nicotine dependence: Secondary | ICD-10-CM

## 2021-10-24 DIAGNOSIS — R002 Palpitations: Secondary | ICD-10-CM

## 2021-10-24 DIAGNOSIS — Z6833 Body mass index (BMI) 33.0-33.9, adult: Secondary | ICD-10-CM

## 2021-10-24 DIAGNOSIS — Z8249 Family history of ischemic heart disease and other diseases of the circulatory system: Secondary | ICD-10-CM

## 2021-10-24 DIAGNOSIS — I340 Nonrheumatic mitral (valve) insufficiency: Secondary | ICD-10-CM

## 2021-10-24 DIAGNOSIS — I10 Essential (primary) hypertension: Secondary | ICD-10-CM

## 2021-10-24 NOTE — Progress Notes (Signed)
Date:  10/24/2021   ID:  Jillian Jefferson, DOB 1961/08/15, MRN [id number]  PCP:  Fatima Sanger, FNP  Cardiologist:  Tessa Lerner, DO, [facility] (established care 08/29/2021) Former Cardiology Providers: Dr. Florian Buff  Date: 10/24/21 Last Office Visit: 08/29/2021  Chief Complaint  Patient presents with   Palpitations   Results   Follow-up    HPI  Jillian Jefferson is a 61 y.o. African-American female who presents to the office with a chief complaint of " follow-up for palpitations and discuss test results." Patient's past medical history and cardiovascular risk factors include: Hypertension, moderate mitral regurgitation, former smoker, obesity due to excess calories, family history of heart disease (mother had a CABG in her 41s).  Patient presented to the office in December 2022 for symptoms of palpitation.  She underwent an echocardiogram which notes preserved LVEF, normal diastolic function, and improvement in mitral regurgitation.  And a 7-day extended Holter monitor noted an average heart rate of 77 bpm without any significant dysrhythmias.  Patient had about 47 triggered events during the monitor which correlated to normal sinus rhythm with rare PVCs.  Clinically patient states that she is completely asymptomatic now and no longer has palpitations.  She is also reduce the dose of bisoprolol to 5 mg p.o. daily.  FUNCTIONAL STATUS: No structured exercise program or daily routine.   ALLERGIES: No Known Allergies  MEDICATION LIST PRIOR TO VISIT: Current Meds  Medication Sig   albuterol (VENTOLIN HFA) 108 (90 Base) MCG/ACT inhaler TAKE 2 PUFFS BY MOUTH EVERY 6 HOURS AS NEEDED FOR WHEEZE OR SHORTNESS OF BREATH   bisoprolol (ZEBETA) 5 MG tablet Take 1 tablet (5 mg total) by mouth daily.   budesonide-formoterol (SYMBICORT) 80-4.5 MCG/ACT inhaler Take 2 puffs first thing in am and then another 2 puffs about 12 hours later.   cetirizine (ZYRTEC) 10 MG tablet Take 10 mg by mouth daily as  needed.    cyclobenzaprine (FLEXERIL) 10 MG tablet Take 10 mg by mouth 3 (three) times daily as needed for muscle spasms.   famotidine (PEPCID) 20 MG tablet One after supper   furosemide (LASIX) 40 MG tablet Take 1 tablet (40 mg total) by mouth daily. (Patient taking differently: Take 40 mg by mouth as needed.)   ibuprofen (ADVIL,MOTRIN) 800 MG tablet Take 800 mg by mouth every 8 (eight) hours as needed for moderate pain.   montelukast (SINGULAIR) 10 MG tablet TAKE 1 TABLET BY MOUTH EVERYDAY AT BEDTIME (Patient taking differently: Take 10 mg by mouth as needed. TAKE 1 TABLET BY MOUTH EVERYDAY AT BEDTIME)   omeprazole (PRILOSEC) 40 MG capsule TAKE 1 CAPSULE BY MOUTH EVERY DAY (Patient taking differently: 40 mg as needed. TAKE 1 CAPSULE BY MOUTH EVERY DAY)   potassium chloride (K-DUR) 10 MEQ tablet Take 10 mEq by mouth daily as needed.    spironolactone (ALDACTONE) 50 MG tablet TAKE 1 TABLET BY MOUTH EVERY DAY     PAST MEDICAL HISTORY: Past Medical History:  Diagnosis Date   Asthma    cold induced   Hypertension     PAST SURGICAL HISTORY: Past Surgical History:  Procedure Laterality Date   MOUTH SURGERY      FAMILY HISTORY: The patient family history includes Diabetes in her father and mother; Heart disease in her mother; Hypertension in her father, mother, and another family member; Renal Disease in her father; Stroke in her father.  SOCIAL HISTORY:  The patient  reports that she quit smoking about 42 years ago. Her smoking  use included cigarettes. She has a 0.90 pack-year smoking history. She has never used smokeless tobacco. She reports current alcohol use. She reports that she does not use drugs.  REVIEW OF SYSTEMS: Review of Systems  Constitutional: Negative for chills and fever.  HENT:  Negative for hoarse voice and nosebleeds.   Eyes:  Negative for discharge, double vision and pain.  Cardiovascular:  Negative for chest pain, claudication, dyspnea on exertion, leg swelling,  near-syncope, orthopnea, palpitations, paroxysmal nocturnal dyspnea and syncope.  Respiratory:  Negative for hemoptysis and shortness of breath.   Musculoskeletal:  Negative for muscle cramps and myalgias.  Gastrointestinal:  Negative for abdominal pain, constipation, diarrhea, hematemesis, hematochezia, melena, nausea and vomiting.  Neurological:  Negative for dizziness and light-headedness.   PHYSICAL EXAM: Vitals with BMI 10/24/2021 10/24/2021 08/29/2021  Height - $Remove'5\' 5"'XpqNYSy$  $RemoveB'5\' 5"'PiWBzvQr$   Weight - 203 lbs 3 oz 205 lbs 10 oz  BMI - 00.34 91.79  Systolic 150 569 794  Diastolic 83 85 78  Pulse 68 74 58    CONSTITUTIONAL: Well-developed and well-nourished. No acute distress.  SKIN: Skin is warm and dry. No rash noted. No cyanosis. No pallor. No jaundice HEAD: Normocephalic and atraumatic.  EYES: No scleral icterus MOUTH/THROAT: Moist oral membranes.  NECK: No JVD present. No thyromegaly noted. No carotid bruits  LYMPHATIC: No visible cervical adenopathy.  CHEST Normal respiratory effort. No intercostal retractions  LUNGS: Clear to auscultation bilaterally.  No stridor. No wheezes. No rales.  CARDIOVASCULAR: Regular rate and rhythm, positive S1-S2, no murmurs rubs or gallops appreciated. ABDOMINAL: Obese, soft, nontender, nondistended, positive bowel sounds in all 4 quadrants, no apparent ascites.  EXTREMITIES: No peripheral edema, warm to touch, 2+ DP and PT pulses HEMATOLOGIC: No significant bruising NEUROLOGIC: Oriented to person, place, and time. Nonfocal. Normal muscle tone.  PSYCHIATRIC: Normal mood and affect. Normal behavior. Cooperative  CARDIAC DATABASE: EKG: 08/29/2021: Sinus  Bradycardia, 57bpm, nonspecific T-abnormality.   Echocardiogram: 09/05/2021: Normal LV systolic function with visual EF 60-65%. Left ventricle cavity is normal in size. Normal left ventricular wall thickness. Normal global wall motion. Normal diastolic filling pattern, normal LAP.  Mild (Grade I) mitral  regurgitation. Mild tricuspid regurgitation. No evidence of pulmonary hypertension. Compared to study 10/06/2019: LVEF remains preserved, Moderate MR is now mild, otherwise no significant change.    Stress Testing: Lexiscan sestamibi stress test 10/19/2017: 1. Resting EKG demonstrates normal sinus rhythm, left axis deviation, poor R-wave progression. Stress EKG is non-diagnostic for ischemia as a pharmacologic stress test. 2. Myocardial perfusion imaging is normal. Overall left ventricular systolic function was normal without regional wall motion abnormalities. The left ventricular ejection fraction was 68%. This is a low risk study.  Heart Catheterization: None  7 day extended Holter monitor: Patch Wear Time: 4 days and 17 hours  Dominant rhythm sinus mechanism. Heart rate 50-138 bpm. Avg HR 77 bpm. No atrial fibrillation, supraventricular or ventricular tachycardia, high grade AV block, pauses (3 seconds or longer). Total ventricular ectopic burden <1%. Total supraventricular ectopic burden <1%. Patient triggered events: 47. Underlying rhythm normal sinus with occasional PVCs.  LABORATORY DATA: CBC Latest Ref Rng & Units 02/10/2021 11/13/2017 09/10/2017  WBC 4.0 - 10.5 K/uL 8.5 7.0 16.8(H)  Hemoglobin 12.0 - 15.0 g/dL 13.0 11.6(L) 10.6(L)  Hematocrit 36.0 - 46.0 % 41.9 35.6(L) 33.4(L)  Platelets 150 - 400 K/uL 313 341.0 306    CMP Latest Ref Rng & Units 02/10/2021 05/05/2018 09/10/2017  Glucose 70 - 99 mg/dL 120(H) 94 110(H)  BUN 6 -  20 mg/dL 18 18 23(H)  Creatinine 0.44 - 1.00 mg/dL 0.76 0.84 0.81  Sodium 135 - 145 mmol/L 140 139 136  Potassium 3.5 - 5.1 mmol/L 3.5 3.9 3.6  Chloride 98 - 111 mmol/L 106 102 97(L)  CO2 22 - 32 mmol/L 24 32 30  Calcium 8.9 - 10.3 mg/dL 9.2 10.0 8.9  Total Protein 6.5 - 8.1 g/dL 8.5(H) - -  Total Bilirubin 0.3 - 1.2 mg/dL 0.2(L) - -  Alkaline Phos 38 - 126 U/L 67 - -  AST 15 - 41 U/L 24 - -  ALT 0 - 44 U/L 16 - -    Lipid Panel  No results  found for: CHOL, TRIG, HDL, CHOLHDL, VLDL, LDLCALC, LDLDIRECT, LABVLDL  No components found for: NTPROBNP No results for input(s): PROBNP in the last 8760 hours. No results for input(s): TSH in the last 8760 hours.  BMP Recent Labs    02/10/21 1238  NA 140  K 3.5  CL 106  CO2 24  GLUCOSE 120*  BUN 18  CREATININE 0.76  CALCIUM 9.2  GFRNONAA >60    HEMOGLOBIN A1C No results found for: HGBA1C, MPG  External Labs: Collected: 07/30/2021 performed by PCP. Sodium 139, potassium 4.7, chloride 105, bicarb 26, BUN 11, creatinine 0.72. AST 15, ALT 10, alkaline phosphatase 77. eGFR >60. Magnesium 2.1. TSH 1.6. Hemoglobin 11.9 g/dL, hematocrit 37.8%   IMPRESSION:    ICD-10-CM   1. Palpitations  R00.2     2. Benign hypertension  I10     3. Nonrheumatic mitral valve regurgitation  I34.0     4. Family history of heart disease  Z82.49     5. Former smoker  Z87.891     39. Class 1 obesity due to excess calories without serious comorbidity with body mass index (BMI) of 33.0 to 33.9 in adult  E66.09    Z68.33      RECOMMENDATIONS: Smera Guyette is a 61 y.o. African-American female whose past medical history and cardiac risk factors include: Hypertension, moderate mitral regurgitation, former smoker, obesity due to excess calories, family history of heart disease (mother had a CABG in her 51s).  Palpitations Resolved. No identifiable reversible cause. TSH within normal limits, hemoglobin within acceptable range Currently on bisoprolol 5 mg p.o. daily. No near-syncope or syncopal events No dysrhythmias on recent 7-day extended Holter monitor. Echo: Preserved LVEF, normal diastolic function, and mild MR and TR.  Benign hypertension Blood pressures within acceptable range, but not at goal. Medications reconciled. Currently managed by primary care provider.  Nonrheumatic mitral valve regurgitation Her moderate mitral regurgitation has improved to mild MR in severity.   Most  likely due to better blood pressure management.   No additional testing warranted at this time.    Class 1 obesity due to excess calories without serious comorbidity with body mass index (BMI) of 34.0 to 34.9 in adult Body mass index is 33.81 kg/m. I reviewed with the patient the importance of diet, regular physical activity/exercise, weight loss.   Patient is educated on increasing physical activity gradually as tolerated.  With the goal of moderate intensity exercise for 30 minutes a day 5 days a week.  FINAL MEDICATION LIST END OF ENCOUNTER: No orders of the defined types were placed in this encounter.   There are no discontinued medications.    Current Outpatient Medications:    albuterol (VENTOLIN HFA) 108 (90 Base) MCG/ACT inhaler, TAKE 2 PUFFS BY MOUTH EVERY 6 HOURS AS NEEDED FOR WHEEZE OR SHORTNESS  OF BREATH, Disp: 18 g, Rfl: 3   bisoprolol (ZEBETA) 5 MG tablet, Take 1 tablet (5 mg total) by mouth daily., Disp: 90 tablet, Rfl: 0   budesonide-formoterol (SYMBICORT) 80-4.5 MCG/ACT inhaler, Take 2 puffs first thing in am and then another 2 puffs about 12 hours later., Disp: 1 Inhaler, Rfl: 12   cetirizine (ZYRTEC) 10 MG tablet, Take 10 mg by mouth daily as needed. , Disp: , Rfl:    cyclobenzaprine (FLEXERIL) 10 MG tablet, Take 10 mg by mouth 3 (three) times daily as needed for muscle spasms., Disp: , Rfl:    famotidine (PEPCID) 20 MG tablet, One after supper, Disp: 30 tablet, Rfl: 11   furosemide (LASIX) 40 MG tablet, Take 1 tablet (40 mg total) by mouth daily. (Patient taking differently: Take 40 mg by mouth as needed.), Disp: 30 tablet, Rfl: 0   ibuprofen (ADVIL,MOTRIN) 800 MG tablet, Take 800 mg by mouth every 8 (eight) hours as needed for moderate pain., Disp: , Rfl:    montelukast (SINGULAIR) 10 MG tablet, TAKE 1 TABLET BY MOUTH EVERYDAY AT BEDTIME (Patient taking differently: Take 10 mg by mouth as needed. TAKE 1 TABLET BY MOUTH EVERYDAY AT BEDTIME), Disp: 90 tablet, Rfl: 5    omeprazole (PRILOSEC) 40 MG capsule, TAKE 1 CAPSULE BY MOUTH EVERY DAY (Patient taking differently: 40 mg as needed. TAKE 1 CAPSULE BY MOUTH EVERY DAY), Disp: 90 capsule, Rfl: 1   potassium chloride (K-DUR) 10 MEQ tablet, Take 10 mEq by mouth daily as needed. , Disp: , Rfl:    spironolactone (ALDACTONE) 50 MG tablet, TAKE 1 TABLET BY MOUTH EVERY DAY, Disp: 90 tablet, Rfl: 0  No orders of the defined types were placed in this encounter.   There are no Patient Instructions on file for this visit.   --Continue cardiac medications as reconciled in final medication list. --Return in about 3 years (around 10/24/2024) for Follow up, Palpitations/MR. Or sooner if needed. --Continue follow-up with your primary care physician regarding the management of your other chronic comorbid conditions.  Patient's questions and concerns were addressed to her satisfaction. She voices understanding of the instructions provided during this encounter.   This note was created using a voice recognition software as a result there may be grammatical errors inadvertently enclosed that do not reflect the nature of this encounter. Every attempt is made to correct such errors.  Total time spent: 20 minutes  Mechele Claude [facility]  Pager: [phone] Office: [phone]

## 2022-01-02 ENCOUNTER — Other Ambulatory Visit: Payer: Self-pay | Admitting: Registered Nurse

## 2022-01-02 DIAGNOSIS — Z8249 Family history of ischemic heart disease and other diseases of the circulatory system: Secondary | ICD-10-CM

## 2022-01-17 NOTE — Progress Notes (Signed)
External Labs: ?Collected: December 27, 2021. ? ?Total cholesterol 178, triglycerides 79, HDL 52, LDL 118, non-HDL 126. ?TSH 1.47. ?Hemoglobin 11.9 g/dL, hematocrit 37.6% ?BUN 21, creatinine 0.77. ?AST 16, ALT 23, alkaline phosphatase 74

## 2022-02-03 ENCOUNTER — Ambulatory Visit
Admission: RE | Admit: 2022-02-03 | Discharge: 2022-02-03 | Disposition: A | Discharge status: Home / Self Care | Payer: No Typology Code available for payment source | Source: Ambulatory Visit | Attending: Registered Nurse | Admitting: Registered Nurse

## 2022-02-03 DIAGNOSIS — Z8249 Family history of ischemic heart disease and other diseases of the circulatory system: Secondary | ICD-10-CM

## 2022-11-01 IMAGING — CT CT CARDIAC CORONARY ARTERY CALCIUM SCORE
3 series · 14 of 20 positions shown, 16 images · non-contrast
Comparison: None Available.

CLINICAL DATA: Hypertension, family history

EXAM:
CT CARDIAC CORONARY ARTERY CALCIUM SCORE
TECHNIQUE: Non-contrast imaging through the heart was performed using
prospective ECG gating. Image post processing was performed on an
independent workstation, allowing for quantitative analysis of the
heart and coronary arteries. Note that this exam targets the heart
and the chest was not imaged in its entirety.

[Series 2: calcium scoring 2.00 qr36 bestdiast 68% hrt calciu · axial · 0.38mm/px · z∈[+1581,+1667]mm · 4 of 73 slices shown]
[im 15/73  vessel]
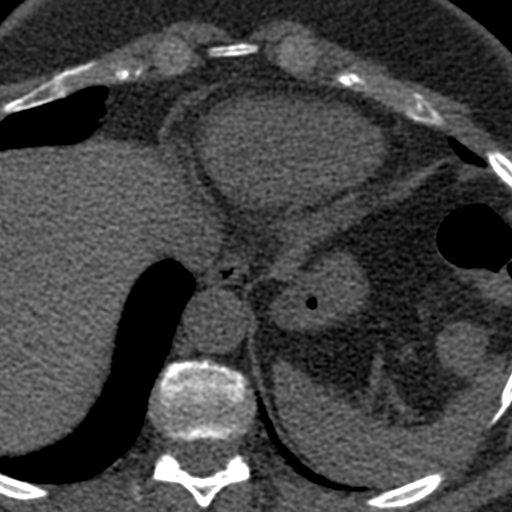
[im 29/73  vessel]
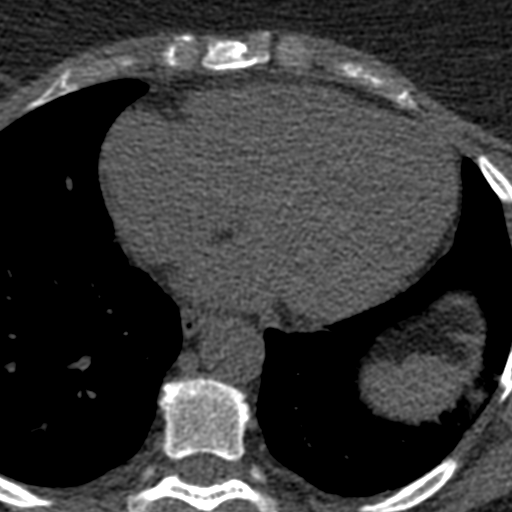
[im 44/73  vessel]
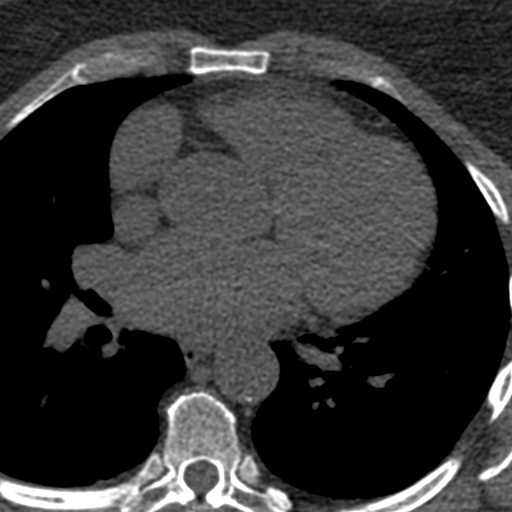
[im 58/73  vessel]
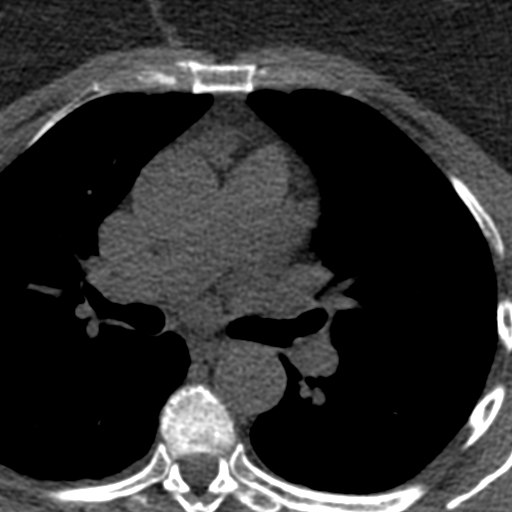

[Series 3: calcium scoring 2.00 br40 bestdiast 68% axial · axial · 0.60mm/px · z∈[+1577,+1673]mm · 5 of 72 slices shown, 7 images]
[im 12/72  vessel]
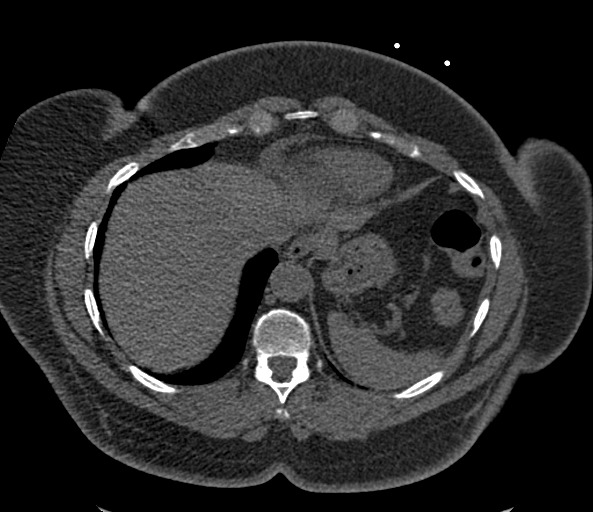
[im 12/72  lung]
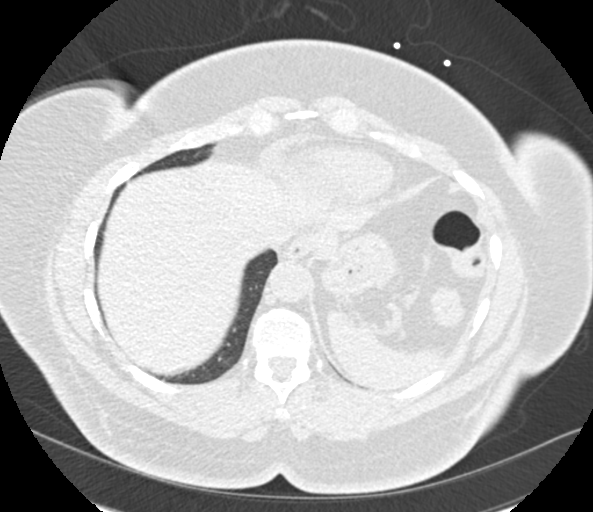
[im 24/72  vessel]
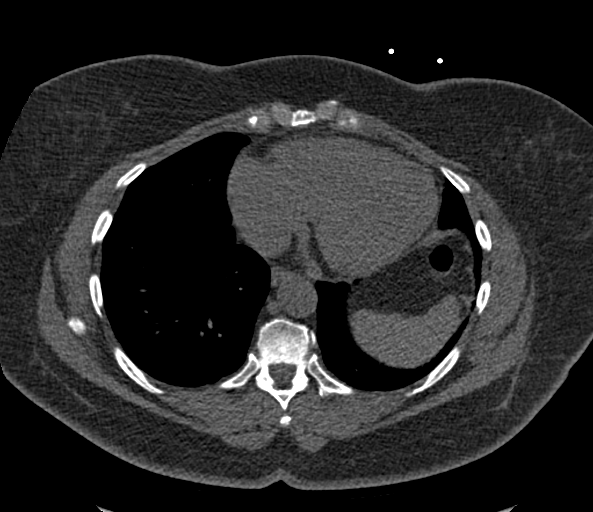
[im 36/72  vessel]
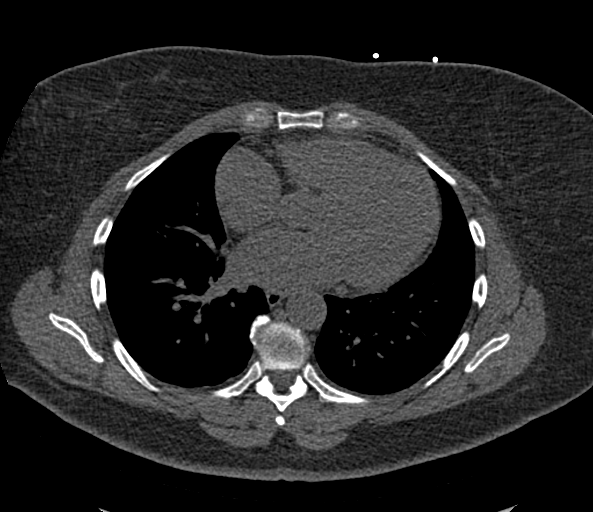
[im 48/72  vessel]
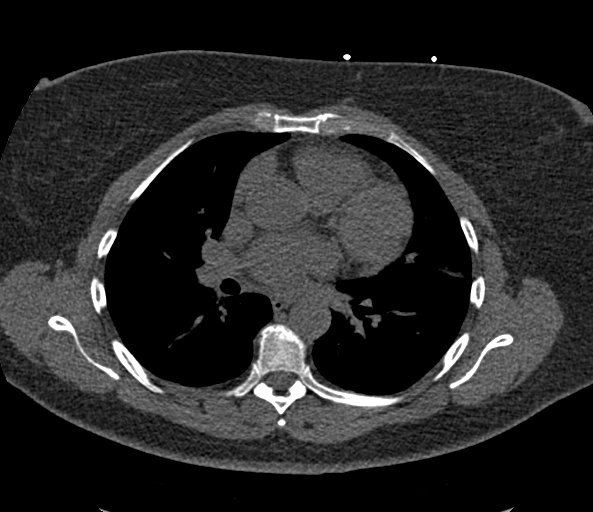
[im 60/72  vessel]
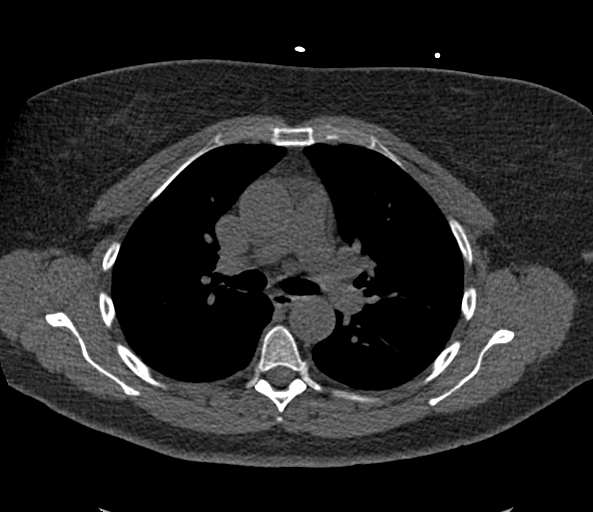
[im 60/72  lung]
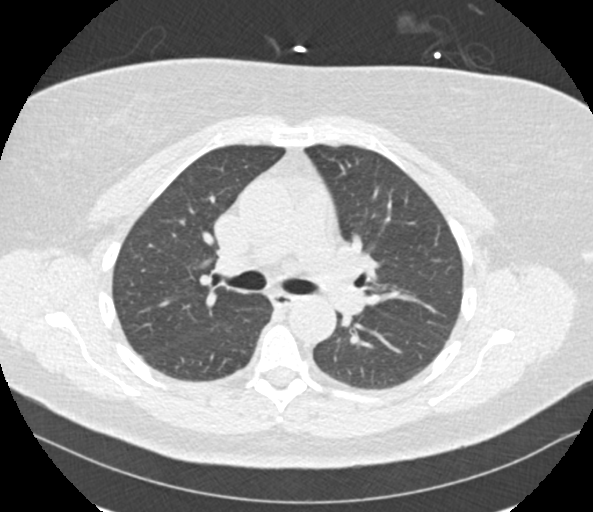

[Series 9: calcium scoring 2.00 br60 bestdiast 68% lungs · axial · 0.60mm/px · z∈[+1577,+1673]mm · 5 of 72 slices shown]
[im 12/72  vessel]
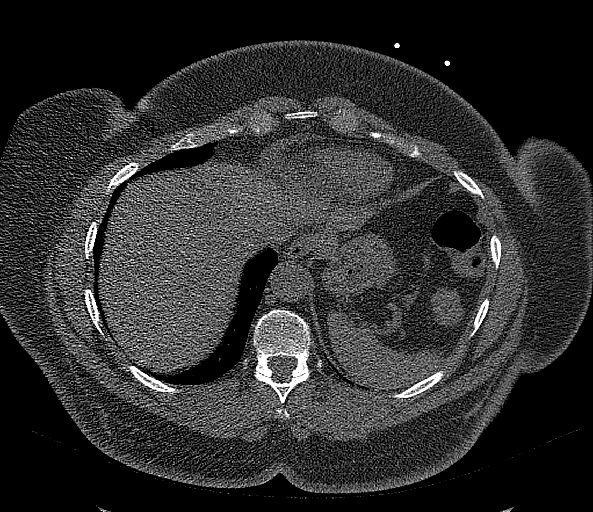
[im 24/72  vessel]
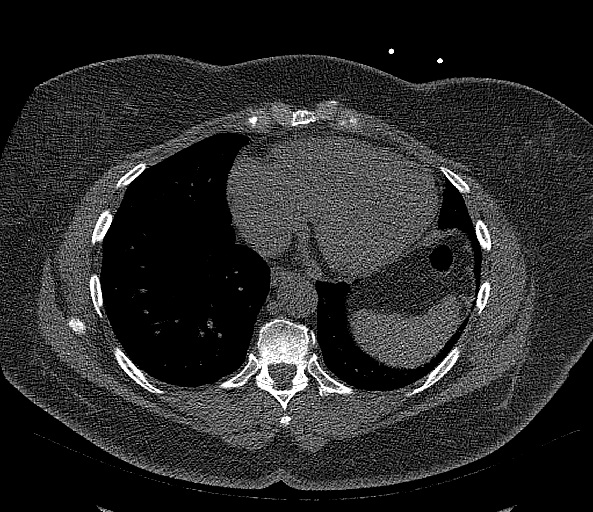
[im 36/72  vessel]
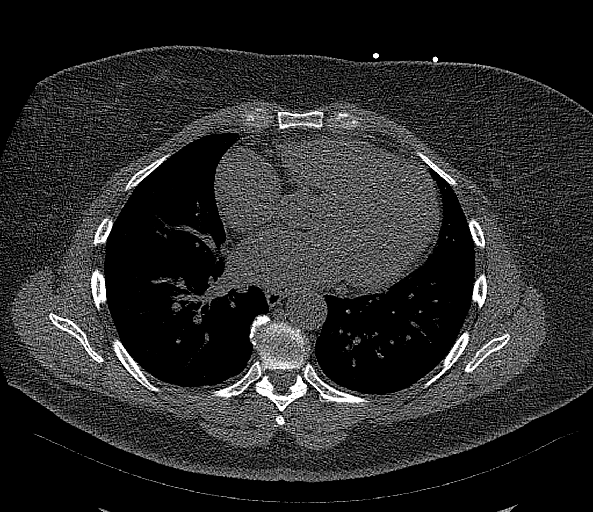
[im 48/72  vessel]
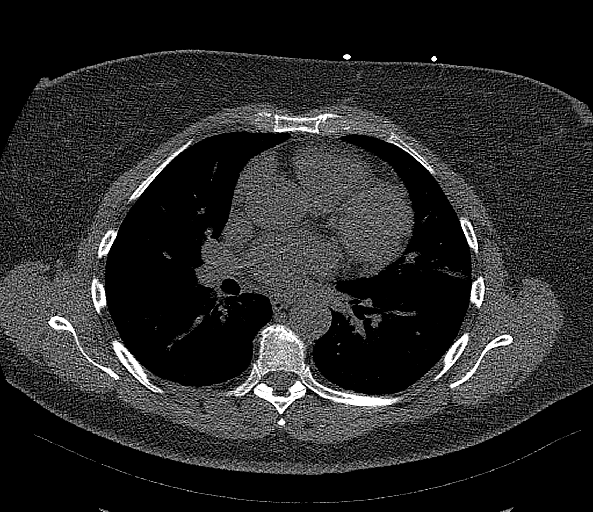
[im 60/72  vessel]
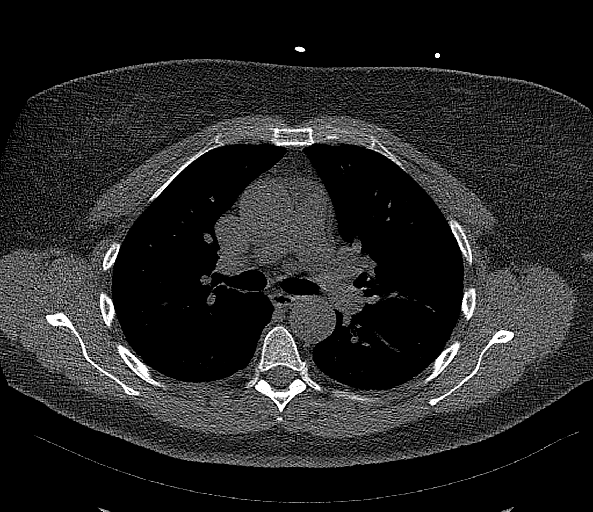

[14 of 20 positions shown; findings below may reference images not displayed]

FINDINGS: CORONARY CALCIUM SCORES:

Left Main: 0

LAD: 0

LCx: 0

RCA: 0

Total Agatston Score: 0

[HOSPITAL] percentile: 0

AORTA MEASUREMENTS:

Ascending Aorta: 6 32 mm

Descending Aorta: 24 mm

OTHER FINDINGS:

Heart is normal size. Aorta normal caliber. Moderate calcifications
in the aortic arch. Scattered descending aortic calcifications. No
adenopathy. Linear scarring or atelectasis in the lingula. No other
confluent airspace opacities or effusions. No acute findings in the
upper abdomen. Chest wall soft tissues are unremarkable. No acute
bony abnormality.
IMPRESSION: No visible coronary artery calcifications. Total coronary calcium
score of 0.

Moderate aortic calcifications/atherosclerosis.

No acute extra cardiac abnormality.

## 2023-07-16 LAB — LAB REPORT - SCANNED: EGFR: 100

## 2023-07-26 NOTE — Progress Notes (Signed)
External Labs: Collected: 07/15/2023 provided by referring physician Total cholesterol 176, triglycerides 50, HDL 58, LDL calculated 108 BUN 14, creatinine 0.67 AST and ALT within normal limits. Potassium 4.3  Records updated. Lipids managed by PCP.  Macrae Wiegman Gladstone, DO, [facility]

## 2024-01-20 ENCOUNTER — Encounter: Payer: Self-pay | Admitting: Cardiology

## 2024-01-20 ENCOUNTER — Ambulatory Visit: Attending: Cardiology | Admitting: Cardiology

## 2024-01-20 VITALS — BP 128/70 | HR 64 | Resp 16 | Ht 65.0 in | Wt 192.0 lb

## 2024-01-20 DIAGNOSIS — E7800 Pure hypercholesterolemia, unspecified: Secondary | ICD-10-CM

## 2024-01-20 DIAGNOSIS — I340 Nonrheumatic mitral (valve) insufficiency: Secondary | ICD-10-CM

## 2024-01-20 DIAGNOSIS — R002 Palpitations: Secondary | ICD-10-CM | POA: Diagnosis not present

## 2024-01-20 DIAGNOSIS — I10 Essential (primary) hypertension: Secondary | ICD-10-CM | POA: Diagnosis not present

## 2024-01-20 NOTE — Patient Instructions (Signed)
 Medication Instructions:  Your physician recommends that you continue on your current medications as directed. Please refer to the Current Medication list given to you today.  *If you need a refill on your cardiac medications before your next appointment, please call your pharmacy*  Lab Work: None ordered today. If you have labs (blood work) drawn today and your tests are completely normal, you will receive your results only by: MyChart Message (if you have MyChart) OR A paper copy in the mail If you have any lab test that is abnormal or we need to change your treatment, we will call you to review the results.  Testing/Procedures: Your physician has requested that you have an echocardiogram in October 2025 prior to your follow-up with Dr. Albert Huff. Echocardiography is a painless test that uses sound waves to create images of your heart. It provides your doctor with information about the size and shape of your heart and how well your heart's chambers and valves are working. This procedure takes approximately one hour. There are no restrictions for this procedure. Please do NOT wear cologne, perfume, aftershave, or lotions (deodorant is allowed). Please arrive 15 minutes prior to your appointment time.  Please note: We ask at that you not bring children with you during ultrasound (echo/ vascular) testing. Due to room size and safety concerns, children are not allowed in the ultrasound rooms during exams. Our front office staff cannot provide observation of children in our lobby area while testing is being conducted. An adult accompanying a patient to their appointment will only be allowed in the ultrasound room at the discretion of the ultrasound technician under special circumstances. We apologize for any inconvenience.   Follow-Up: At [facility], you and your health needs are our priority.  As part of our continuing mission to provide you with exceptional heart care, we have created designated  Provider Care Teams.  These Care Teams include your primary Cardiologist (physician) and Advanced Practice Providers (APPs -  Physician Assistants and Nurse Practitioners) who all work together to provide you with the care you need, when you need it.  We recommend signing up for the patient portal called "MyChart".  Sign up information is provided on this After Visit Summary.  MyChart is used to connect with patients for Virtual Visits (Telemedicine).  Patients are able to view lab/test results, encounter notes, upcoming appointments, etc.  Non-urgent messages can be sent to your provider as well.   To learn more about what you can do with MyChart, go to [email or web address].    Your next appointment:   6 month(s)  The format for your next appointment:   In Person  Provider:   Olinda Bertrand, [facility]  Other Instructions You have been referred to PharmD to meet with one of our pharmacists to discuss blood pressure management.  --------------------------------------------------------------------------------------   1st Floor: - Lobby - Registration  - Pharmacy  - Lab - Cafe  2nd Floor: - PV Lab - Diagnostic Testing (echo, CT, nuclear med)  3rd Floor: - Vacant  4th Floor: - TCTS (cardiothoracic surgery) - AFib Clinic - Structural Heart Clinic - Vascular Surgery  - Vascular Ultrasound  5th Floor: - HeartCare Cardiology (general and EP) - Clinical Pharmacy for coumadin, hypertension, lipid, weight-loss medications, and med management appointments    Valet parking services will be available as well.

## 2024-01-20 NOTE — Progress Notes (Signed)
 Cardiology Office Note:  .   Date:  01/20/2024  ID:  Jillian Jefferson, DOB May 01, 1961, MRN [id number] PCP:  Jhon Moselle, FNP  Former Cardiology Providers: Dr. Scot Cutter  [facility] Providers Cardiologist:  Olinda Bertrand, DO , [facility] (established care 08/29/2021) Electrophysiologist:  None  Click to update primary MD,subspecialty MD or APP then REFRESH:1}    Chief Complaint  Patient presents with   Follow-up    HTN    History of Present Illness: Jillian Jefferson is a 63 y.o. African-American female whose past medical history and cardiovascular risk factors includes: Hypertension, mitral regurgitation, former smoker, obesity due to excess calories, family history of heart disease (mother had a CABG in her 99s).   Last seen in the office in January 2023 for evaluation of palpitations.  She underwent appropriate cardiovascular workup as outlined below and presents today for 2-year follow-up visit.  Patient is accompanied by her fianc [name] and patient provides verbal consent having him present during today's office encounter.  Patient is referred back to the practice for blood pressure management according to the patient at the request of her PCP.  Patient states her blood pressures have been elevated with SBP at home ranging between 145-165 mmHg for the last several months.  Her PCP has tried to [employer] medical therapy without much success.  She has an appointment to be evaluated for sleep apnea.  She could do better with reducing salt in her diet and eating more heart healthy meals.  She currently takes bisoprolol  twice a day and Lasix  and spironolactone and losartan at night.  Patient tells me that her losartan is being changed to olmesartan by her PCP but she has not picked up the prescription.  She denies anginal chest pain or heart failure symptoms.  She had a coronary calcium score in the recent past which notes a total CAC of 0 but aortic valve calcification was  reported.  She has prior history of mitral regurgitation which had improved with better blood pressure management in the past.  Review of Systems: .   Review of Systems  Cardiovascular:  Negative for chest pain, claudication, irregular heartbeat, leg swelling, near-syncope, orthopnea, palpitations, paroxysmal nocturnal dyspnea and syncope.  Respiratory:  Negative for shortness of breath.   Hematologic/Lymphatic: Negative for bleeding problem.    Studies Reviewed:   EKG: EKG Interpretation Date/Time:  Wednesday January 20 2024 13:32:04 EDT Ventricular Rate:  61 PR Interval:  174 QRS Duration:  88 QT Interval:  402 QTC Calculation: 404 R Axis:   -31  Text Interpretation: Normal sinus rhythm Left axis deviation Moderate voltage criteria for LVH, may be normal variant ( R in aVL , Cornell product ) Nonspecific T wave abnormality When compared with ECG of 07-Sep-2017 23:54, Since last tracing rate slower Confirmed by Olinda Bertrand [phone]) on 01/20/2024 1:39:23 PM  Echocardiogram: 09/05/2021: Normal LV systolic function with visual EF 60-65%. Left ventricle cavity is normal in size. Normal left ventricular wall thickness. Normal global wall motion. Normal diastolic filling pattern, normal LAP.  Mild (Grade I) mitral regurgitation. Mild tricuspid regurgitation. No evidence of pulmonary hypertension. Compared to study 10/06/2019: LVEF remains preserved, Moderate MR is now mild, otherwise no significant change.   Stress Testing: Lexiscan sestamibi stress test 10/19/2017: low risk study.  Cardiac monitor: 7 day extended Holter monitor: Patch Wear Time: 4 days and 17 hours  Dominant rhythm sinus mechanism. Heart rate 50-138 bpm. Avg HR 77 bpm. No atrial fibrillation, supraventricular or  ventricular tachycardia, high grade AV block, pauses (3 seconds or longer). Total ventricular ectopic burden <1%. Total supraventricular ectopic burden <1%. Patient triggered events: 47. Underlying rhythm  normal sinus with occasional PVCs.  CORONARY CALCIUM SCORES: 01/2022: Total Agatston Score: 0, MESA database percentile: 0 Moderate aortic calcifications/atherosclerosis. No acute extra cardiac abnormality.  RADIOLOGY: NA  Risk Assessment/Calculations:   NA   Labs:       Latest Ref Rng & Units 02/10/2021   12:38 PM 11/13/2017   11:41 AM 09/10/2017    3:13 AM  CBC  WBC 4.0 - 10.5 K/uL 8.5  7.0  16.8   Hemoglobin 12.0 - 15.0 g/dL 16.1  09.6  04.5   Hematocrit 36.0 - 46.0 % 41.9  35.6  33.4   Platelets 150 - 400 K/uL 313  341.0  306        Latest Ref Rng & Units 02/10/2021   12:38 PM 05/05/2018    2:40 PM 09/10/2017    3:13 AM  BMP  Glucose 70 - 99 mg/dL 409  94  811   BUN 6 - 20 mg/dL 18  18  23    Creatinine 0.44 - 1.00 mg/dL 9.14  7.82  9.56   Sodium 135 - 145 mmol/L 140  139  136   Potassium 3.5 - 5.1 mmol/L 3.5  3.9  3.6   Chloride 98 - 111 mmol/L 106  102  97   CO2 22 - 32 mmol/L 24  32  30   Calcium 8.9 - 10.3 mg/dL 9.2  21.3  8.9       Latest Ref Rng & Units 02/10/2021   12:38 PM 05/05/2018    2:40 PM 09/10/2017    3:13 AM  CMP  Glucose 70 - 99 mg/dL 086  94  578   BUN 6 - 20 mg/dL 18  18  23    Creatinine 0.44 - 1.00 mg/dL 4.69  6.29  5.28   Sodium 135 - 145 mmol/L 140  139  136   Potassium 3.5 - 5.1 mmol/L 3.5  3.9  3.6   Chloride 98 - 111 mmol/L 106  102  97   CO2 22 - 32 mmol/L 24  32  30   Calcium 8.9 - 10.3 mg/dL 9.2  41.3  8.9   Total Protein 6.5 - 8.1 g/dL 8.5     Total Bilirubin 0.3 - 1.2 mg/dL 0.2     Alkaline Phos 38 - 126 U/L 67     AST 15 - 41 U/L 24     ALT 0 - 44 U/L 16       No results found for: "CHOL", "HDL", "LDLCALC", "LDLDIRECT", "TRIG", "CHOLHDL" No results for input(s): "LIPOA" in the last 8760 hours. No components found for: "NTPROBNP" No results for input(s): "PROBNP" in the last 8760 hours. No results for input(s): "TSH" in the last 8760 hours.  External Labs: Collected: 07/15/2023 provided by referring physician Total  cholesterol 176, triglycerides 50, HDL 58, LDL calculated 108 BUN 14, creatinine 0.67 AST and ALT within normal limits. Potassium 4.3  Physical Exam:    Today's Vitals   01/20/24 1329  BP: 128/70  Pulse: 64  Resp: 16  SpO2: 95%  Weight: 192 lb (87.1 kg)  Height: 5\' 5"  (1.651 m)   Body mass index is 31.95 kg/m. Wt Readings from Last 3 Encounters:  01/20/24 192 lb (87.1 kg)  10/24/21 203 lb 3.2 oz (92.2 kg)  08/29/21 205 lb 9.6 oz (93.3 kg)  Physical Exam  Constitutional: No distress.  hemodynamically stable  Neck: No JVD present.  Cardiovascular: Normal rate, regular rhythm, S1 normal and S2 normal. Exam reveals no gallop, no S3 and no S4.  No murmur heard. Pulmonary/Chest: Effort normal and breath sounds normal. No stridor. She has no wheezes. She has no rales.  Musculoskeletal:        General: No edema.     Cervical back: Neck supple.  Skin: Skin is warm.   Impression & Recommendation(s):  Impression:   ICD-10-CM   1. Benign hypertension  I10 AMB Referral to [facility] Pharm-D    2. Palpitations  R00.2 EKG 12-Lead    3. Pure hypercholesterolemia  E78.00     4. Nonrheumatic mitral valve regurgitation  I34.0 ECHOCARDIOGRAM COMPLETE       Recommendation(s):  Benign hypertension Office blood pressures are very well-controlled. Home blood pressures according to the patient and fianc are not well-controlled with SBP ranging between 145-165 mmHg. With the exception of bisoprolol  she takes all of her antihypertensive medications in the evening. She is also currently being evaluated for sleep apnea. And her losartan was recently changed to olmesartan she still has to pick up the prescription. For now I recommended that she takes her bisoprolol  5 mg p.o. twice daily as originally prescribed, continue to use Lasix  on as needed basis, start taking spironolactone 50 mg p.o. every morning and start taking olmesartan at night.  Patient is not sure the dosage of her  olmesartan. I will refer her to hypertension clinic managed by Pharm.D. for further titration of medical therapy I have asked her to also bring her blood pressure logs and at the next office visit Reemphasized importance of low-salt diet.  Palpitations Chronic and stable. Continue bisoprolol   Pure hypercholesterolemia Chronic, uncontrolled Most recent LDL is 186 mg/dL as of April 2025, [facility] database. Patient tells me that her atorvastatin dose was increased to 40 mg p.o. nightly by her PCP and she has fasting lipids with PCP.  Will defer management to PCP for now until unless cardiology is requested to help. Her 10-year risk of ASCVD with her current parameters is 8.2% Though her coronary calcium score is 0 I have strongly encouraged her to work on modifiable cardiovascular risk factors for long-term heart health.  Nonrheumatic mitral valve regurgitation Chronic, stable We will reevaluate the severity as a 3-year follow-up study prior to the next office visit   Orders Placed:  Orders Placed This Encounter  Procedures   AMB Referral to [facility] Pharm-D    Referral Priority:   Routine    Referral Type:   Consultation    Referral Reason:   Specialty Services Required    Number of Visits Requested:   1   EKG 12-Lead   ECHOCARDIOGRAM COMPLETE    Standing Status:   Future    Expected Date:   06/29/2024    Expiration Date:   01/19/2025    Where should this test be performed:   [facility])    Does the patient weigh less than or greater than 250 lbs?:   Patient weighs less than 250 lbs    Perflutren DEFINITY (image enhancing agent) should be administered unless hypersensitivity or allergy  exist:   Administer Perflutren    Reason for exam-Echo:   Other-Full Diagnosis List    Full ICD-10/Reason for Exam:   Mitral valve regurgitation [201049]     Final Medication List:   No orders of the defined types were placed in  this encounter.   There are no discontinued  medications.   Current Outpatient Medications:    albuterol  (VENTOLIN  HFA) 108 (90 Base) MCG/ACT inhaler, TAKE 2 PUFFS BY MOUTH EVERY 6 HOURS AS NEEDED FOR WHEEZE OR SHORTNESS OF BREATH, Disp: 18 g, Rfl: 3   bisoprolol  (ZEBETA ) 5 MG tablet, Take 1 tablet (5 mg total) by mouth daily. (Patient taking differently: Take 5 mg by mouth in the morning and at bedtime.), Disp: 90 tablet, Rfl: 0   budesonide -formoterol  (SYMBICORT ) 80-4.5 MCG/ACT inhaler, Take 2 puffs first thing in am and then another 2 puffs about 12 hours later., Disp: 1 Inhaler, Rfl: 12   cetirizine (ZYRTEC) 10 MG tablet, Take 10 mg by mouth daily as needed. , Disp: , Rfl:    cyclobenzaprine (FLEXERIL) 10 MG tablet, Take 10 mg by mouth 3 (three) times daily as needed for muscle spasms., Disp: , Rfl:    famotidine  (PEPCID ) 20 MG tablet, One after supper, Disp: 30 tablet, Rfl: 11   furosemide  (LASIX ) 40 MG tablet, Take 1 tablet (40 mg total) by mouth daily. (Patient taking differently: Take 40 mg by mouth as needed.), Disp: 30 tablet, Rfl: 0   ibuprofen (ADVIL,MOTRIN) 800 MG tablet, Take 800 mg by mouth every 8 (eight) hours as needed for moderate pain., Disp: , Rfl:    losartan (COZAAR) 50 MG tablet, Take 50 mg by mouth daily., Disp: , Rfl:    montelukast  (SINGULAIR ) 10 MG tablet, TAKE 1 TABLET BY MOUTH EVERYDAY AT BEDTIME (Patient taking differently: Take 10 mg by mouth as needed. TAKE 1 TABLET BY MOUTH EVERYDAY AT BEDTIME), Disp: 90 tablet, Rfl: 5   omeprazole  (PRILOSEC) 40 MG capsule, TAKE 1 CAPSULE BY MOUTH EVERY DAY (Patient taking differently: 40 mg as needed. TAKE 1 CAPSULE BY MOUTH EVERY DAY), Disp: 90 capsule, Rfl: 1   potassium chloride  (K-DUR) 10 MEQ tablet, Take 10 mEq by mouth daily as needed. , Disp: , Rfl:    spironolactone (ALDACTONE) 50 MG tablet, TAKE 1 TABLET BY MOUTH EVERY DAY, Disp: 90 tablet, Rfl: 0  Consent:   NA  Disposition:   6 months follow-up sooner if needed  Her questions and concerns were addressed to  her satisfaction. She voices understanding of the recommendations provided during this encounter.    Signed, Olinda Bertrand, DO, [facility]  [facility]  [address] [place], [place] [address] 01/20/2024 6:56 PM

## 2024-03-11 ENCOUNTER — Ambulatory Visit: Attending: Pharmacist | Admitting: Pharmacist

## 2024-03-11 DIAGNOSIS — Z1211 Encounter for screening for malignant neoplasm of colon: Secondary | ICD-10-CM | POA: Insufficient documentation

## 2024-03-11 DIAGNOSIS — N816 Rectocele: Secondary | ICD-10-CM | POA: Insufficient documentation

## 2024-03-11 DIAGNOSIS — K219 Gastro-esophageal reflux disease without esophagitis: Secondary | ICD-10-CM | POA: Insufficient documentation

## 2024-03-11 NOTE — Progress Notes (Deleted)
 Patient ID: Jillian Jefferson                 DOB: 17-Jun-1961                      MRN: [id number]     HPI: Jillian Jefferson is a 63 y.o. female referred by Dr. Aaron Aas to HTN clinic. PMH is significant for  Current HTN meds:  Previously tried:  BP goal:   Family History:   Social History:   Diet:   Exercise:   Home BP readings:   Wt Readings from Last 3 Encounters:  01/20/24 192 lb (87.1 kg)  10/24/21 203 lb 3.2 oz (92.2 kg)  08/29/21 205 lb 9.6 oz (93.3 kg)   BP Readings from Last 3 Encounters:  01/20/24 128/70  10/24/21 140/83  08/29/21 (!) 154/78   Pulse Readings from Last 3 Encounters:  01/20/24 64  10/24/21 68  08/29/21 (!) 58    Renal function: CrCl cannot be calculated (Patient's most recent lab result is older than the maximum 21 days allowed.).  Past Medical History:  Diagnosis Date   Asthma    cold induced   Hypertension    Mitral regurgitation     Current Outpatient Medications on File Prior to Visit  Medication Sig Dispense Refill   ALPRAZolam (XANAX) 0.25 MG tablet Take 0.25 mg by mouth.     atorvastatin (LIPITOR) 40 MG tablet Take 40 mg by mouth daily.     olmesartan (BENICAR) 20 MG tablet Take 20 mg by mouth daily.     oxyCODONE (OXY IR/ROXICODONE) 5 MG immediate release tablet Take 5 mg by mouth every 4 (four) hours as needed.     albuterol  (VENTOLIN  HFA) 108 (90 Base) MCG/ACT inhaler TAKE 2 PUFFS BY MOUTH EVERY 6 HOURS AS NEEDED FOR WHEEZE OR SHORTNESS OF BREATH 18 g 3   bisoprolol  (ZEBETA ) 5 MG tablet Take 1 tablet (5 mg total) by mouth daily. (Patient taking differently: Take 5 mg by mouth in the morning and at bedtime.) 90 tablet 0   budesonide -formoterol  (SYMBICORT ) 80-4.5 MCG/ACT inhaler Take 2 puffs first thing in am and then another 2 puffs about 12 hours later. 1 Inhaler 12   cetirizine (ZYRTEC) 10 MG tablet Take 10 mg by mouth daily as needed.      cyclobenzaprine (FLEXERIL) 10 MG tablet Take 10 mg by mouth 3 (three) times daily as needed for  muscle spasms.     famotidine  (PEPCID ) 20 MG tablet One after supper 30 tablet 11   furosemide  (LASIX ) 40 MG tablet Take 1 tablet (40 mg total) by mouth daily. (Patient taking differently: Take 40 mg by mouth as needed.) 30 tablet 0   ibuprofen (ADVIL,MOTRIN) 800 MG tablet Take 800 mg by mouth every 8 (eight) hours as needed for moderate pain.     montelukast  (SINGULAIR ) 10 MG tablet TAKE 1 TABLET BY MOUTH EVERYDAY AT BEDTIME (Patient taking differently: Take 10 mg by mouth as needed. TAKE 1 TABLET BY MOUTH EVERYDAY AT BEDTIME) 90 tablet 5   omeprazole  (PRILOSEC) 40 MG capsule TAKE 1 CAPSULE BY MOUTH EVERY DAY (Patient taking differently: 40 mg as needed. TAKE 1 CAPSULE BY MOUTH EVERY DAY) 90 capsule 1   potassium chloride  (K-DUR) 10 MEQ tablet Take 10 mEq by mouth daily as needed.      spironolactone (ALDACTONE) 50 MG tablet TAKE 1 TABLET BY MOUTH EVERY DAY 90 tablet 0   No current facility-administered medications on file prior to  visit.    No Known Allergies   Assessment/Plan:  1. Hypertension -

## 2024-04-12 ENCOUNTER — Other Ambulatory Visit: Payer: Self-pay | Admitting: Nephrology

## 2024-04-12 DIAGNOSIS — R809 Proteinuria, unspecified: Secondary | ICD-10-CM

## 2024-04-25 ENCOUNTER — Ambulatory Visit
Admission: RE | Admit: 2024-04-25 | Discharge: 2024-04-25 | Disposition: A | Discharge status: Home / Self Care | Source: Ambulatory Visit | Attending: Nephrology | Admitting: Nephrology

## 2024-04-25 DIAGNOSIS — R809 Proteinuria, unspecified: Secondary | ICD-10-CM

## 2024-06-02 ENCOUNTER — Other Ambulatory Visit (HOSPITAL_COMMUNITY): Payer: Self-pay | Admitting: Nephrology

## 2024-06-02 DIAGNOSIS — R809 Proteinuria, unspecified: Secondary | ICD-10-CM

## 2024-06-02 DIAGNOSIS — R319 Hematuria, unspecified: Secondary | ICD-10-CM

## 2024-06-20 ENCOUNTER — Other Ambulatory Visit: Payer: Self-pay | Admitting: Radiology

## 2024-06-20 DIAGNOSIS — R809 Proteinuria, unspecified: Secondary | ICD-10-CM

## 2024-06-22 ENCOUNTER — Other Ambulatory Visit (HOSPITAL_COMMUNITY): Payer: Self-pay | Admitting: Student

## 2024-06-22 ENCOUNTER — Other Ambulatory Visit: Payer: Self-pay | Admitting: Student

## 2024-06-22 NOTE — H&P (Signed)
 Chief Complaint: Patient was seen in consultation today for hematuria/proteinuria  Referring Physician(s): Upton,Elizabeth  Supervising Physician: Jennefer Rover  Patient Status: [facility] - Out-pt  History of Present Illness: Jillian Jefferson is a 63 y.o. female with a medical history significant for HTN, OSA, anxiety and obesity who was referred to Nephrology for hematuria and proteinuria. This issue began April 2025 and a serologic work up by Nephrology was positive for ANA and protein/creatinine ratio of 2. The patient has a strong family history of renal disease.   Interventional Radiology has been asked to evaluate this patient for an image-guided non-focal renal biopsy for further work up.   Past Medical History:  Diagnosis Date   Asthma    cold induced   Hypertension    Mitral regurgitation     Past Surgical History:  Procedure Laterality Date   MOUTH SURGERY      Allergies: Patient has no known allergies.  Medications: Prior to Admission medications   Medication Sig Start Date End Date Taking? Authorizing Provider  albuterol  (VENTOLIN  HFA) 108 (90 Base) MCG/ACT inhaler TAKE 2 PUFFS BY MOUTH EVERY 6 HOURS AS NEEDED FOR WHEEZE OR SHORTNESS OF BREATH 04/27/20   Darlean Ozell NOVAK, MD  ALPRAZolam (XANAX) 0.25 MG tablet Take 0.25 mg by mouth. 10/29/23   [provider]  atorvastatin (LIPITOR) 40 MG tablet Take 40 mg by mouth daily. 01/11/24   [provider]  bisoprolol  (ZEBETA ) 5 MG tablet Take 1 tablet (5 mg total) by mouth daily. Patient taking differently: Take 5 mg by mouth in the morning and at bedtime. 08/29/21 01/20/24  Tolia, Sunit, DO  budesonide -formoterol  (SYMBICORT ) 80-4.5 MCG/ACT inhaler Take 2 puffs first thing in am and then another 2 puffs about 12 hours later. 01/10/20   Tonna Redell SQUIBB, NP  cetirizine (ZYRTEC) 10 MG tablet Take 10 mg by mouth daily as needed.     [provider]  cyclobenzaprine (FLEXERIL) 10 MG tablet Take 10 mg by mouth 3  (three) times daily as needed for muscle spasms.    [provider]  famotidine  (PEPCID ) 20 MG tablet One after supper 01/27/20   Darlean Ozell NOVAK, MD  furosemide  (LASIX ) 40 MG tablet Take 1 tablet (40 mg total) by mouth daily. Patient taking differently: Take 40 mg by mouth as needed. 09/10/17 01/20/24  Armanda Standing, MD  ibuprofen (ADVIL,MOTRIN) 800 MG tablet Take 800 mg by mouth every 8 (eight) hours as needed for moderate pain.    [provider]  montelukast  (SINGULAIR ) 10 MG tablet TAKE 1 TABLET BY MOUTH EVERYDAY AT BEDTIME Patient taking differently: Take 10 mg by mouth as needed. TAKE 1 TABLET BY MOUTH EVERYDAY AT BEDTIME 01/10/20   Tonna Redell SQUIBB, NP  olmesartan (BENICAR) 20 MG tablet Take 20 mg by mouth daily. 01/18/24   [provider]  omeprazole  (PRILOSEC) 40 MG capsule TAKE 1 CAPSULE BY MOUTH EVERY DAY Patient taking differently: 40 mg as needed. TAKE 1 CAPSULE BY MOUTH EVERY DAY 01/10/20   Tonna Redell SQUIBB, NP  oxyCODONE (OXY IR/ROXICODONE) 5 MG immediate release tablet Take 5 mg by mouth every 4 (four) hours as needed. 05/31/21   [provider]  potassium chloride  (K-DUR) 10 MEQ tablet Take 10 mEq by mouth daily as needed.     [provider]  spironolactone (ALDACTONE) 50 MG tablet TAKE 1 TABLET BY MOUTH EVERY DAY 06/08/19   Ladona Heinz, MD     Family History  Problem Relation Age of Onset  Hypertension Mother    Diabetes Mother    Heart disease Mother    Hypertension Father    Diabetes Father    Renal Disease Father    Stroke Father    Hypertension Other     Social History   Socioeconomic History   Marital status: Single    Spouse name: Not on file   Number of children: 2   Years of education: Not on file   Highest education level: Not on file  Occupational History   Not on file  Tobacco Use   Smoking status: Former    Current packs/day: 0.00    Average packs/day: 0.3 packs/day for 3.0 years (0.9 ttl pk-yrs)    Types:  Cigarettes    Start date: 4    Quit date: 5    Years since quitting: 44.7   Smokeless tobacco: Never  Vaping Use   Vaping status: Never Used  Substance and Sexual Activity   Alcohol use: Yes    Comment: occas   Drug use: No   Sexual activity: Not on file  Other Topics Concern   Not on file  Social History Narrative   Not on file   Social Drivers of Health   Financial Resource Strain: Not on file  Food Insecurity: Not on file  Transportation Needs: Not on file  Physical Activity: Not on file  Stress: Not on file  Social Connections: Not on file    Review of Systems: A 12 point ROS discussed and pertinent positives are indicated in the HPI above.  All other systems are negative.  Review of Systems  Vital Signs: There were no vitals taken for this visit.  Physical Exam  Imaging: No results found.  Labs:  CBC: No results for input(s): WBC, HGB, HCT, PLT in the last 8760 hours.  COAGS: No results for input(s): INR, APTT in the last 8760 hours.  BMP: No results for input(s): NA, K, CL, CO2, GLUCOSE, BUN, CALCIUM, CREATININE, GFRNONAA, GFRAA in the last 8760 hours.  Invalid input(s): CMP  LIVER FUNCTION TESTS: No results for input(s): BILITOT, AST, ALT, ALKPHOS, PROT, ALBUMIN in the last 8760 hours.  TUMOR MARKERS: No results for input(s): AFPTM, CEA, CA199, CHROMGRNA in the last 8760 hours.  Assessment and Plan:  Hematuria/proteinuria; positive ANA: Jillian Jefferson, 63 year old female, presents today to the [facility] Interventional Radiology department for an image-guided non-focal renal biopsy.   Risks and benefits of this procedure were discussed with the patient and/or patient's family including, but not limited to bleeding, infection, damage to adjacent structures or low yield requiring additional tests.  All of the questions were answered and there is agreement to proceed. She has been NPO. She  does not take any blood-thinning medications.   Consent signed and in chart.  Thank you for this interesting consult.  I greatly enjoyed meeting Jillian Jefferson and look forward to participating in their care.  A copy of this report was sent to the requesting provider on this date.  Electronically Signed: Warren Dais, AGACNP-BC 06/22/2024, 9:16 AM   I spent a total of  30 Minutes   in face to face in clinical consultation, greater than 50% of which was counseling/coordinating care for hematuria/proteinuria.

## 2024-06-23 ENCOUNTER — Other Ambulatory Visit: Payer: Self-pay

## 2024-06-23 ENCOUNTER — Ambulatory Visit (HOSPITAL_COMMUNITY)
Admission: RE | Admit: 2024-06-23 | Discharge: 2024-06-23 | Disposition: A | Discharge status: Home / Self Care | Source: Ambulatory Visit | Attending: Nephrology | Admitting: Nephrology

## 2024-06-23 DIAGNOSIS — G4733 Obstructive sleep apnea (adult) (pediatric): Secondary | ICD-10-CM | POA: Insufficient documentation

## 2024-06-23 DIAGNOSIS — R319 Hematuria, unspecified: Secondary | ICD-10-CM | POA: Insufficient documentation

## 2024-06-23 DIAGNOSIS — R809 Proteinuria, unspecified: Secondary | ICD-10-CM | POA: Diagnosis not present

## 2024-06-23 DIAGNOSIS — E669 Obesity, unspecified: Secondary | ICD-10-CM | POA: Insufficient documentation

## 2024-06-23 DIAGNOSIS — F419 Anxiety disorder, unspecified: Secondary | ICD-10-CM | POA: Diagnosis not present

## 2024-06-23 DIAGNOSIS — I10 Essential (primary) hypertension: Secondary | ICD-10-CM | POA: Diagnosis not present

## 2024-06-23 LAB — CBC
HCT: 36.6 % (ref 36.0–46.0)
Hemoglobin: 11.2 g/dL — ABNORMAL LOW (ref 12.0–15.0)
MCH: 28.1 pg (ref 26.0–34.0)
MCHC: 30.6 g/dL (ref 30.0–36.0)
MCV: 92 fL (ref 80.0–100.0)
Platelets: 300 K/uL (ref 150–400)
RBC: 3.98 MIL/uL (ref 3.87–5.11)
RDW: 13.6 % (ref 11.5–15.5)
WBC: 8.3 K/uL (ref 4.0–10.5)
nRBC: 0 % (ref 0.0–0.2)

## 2024-06-23 LAB — PROTIME-INR
INR: 0.8 (ref 0.8–1.2)
Prothrombin Time: 11.6 s (ref 11.4–15.2)

## 2024-06-23 MED ORDER — MIDAZOLAM HCL 2 MG/2ML IJ SOLN
INTRAMUSCULAR | Status: AC
  Filled 2024-06-23: qty 4

## 2024-06-23 MED ORDER — FENTANYL CITRATE (PF) 100 MCG/2ML IJ SOLN
INTRAMUSCULAR | Status: AC
  Filled 2024-06-23: qty 2

## 2024-06-23 MED ORDER — MIDAZOLAM HCL 2 MG/2ML IJ SOLN
INTRAMUSCULAR | Status: AC | PRN
  Administered 2024-06-23 (×2): 1 mg via INTRAVENOUS

## 2024-06-23 MED ORDER — LIDOCAINE HCL (PF) 1 % IJ SOLN
10.0000 mL | Freq: Once | INTRAMUSCULAR | Status: AC
  Administered 2024-06-23: 10 mL

## 2024-06-23 MED ORDER — FENTANYL CITRATE (PF) 100 MCG/2ML IJ SOLN
INTRAMUSCULAR | Status: AC | PRN
  Administered 2024-06-23: 50 ug via INTRAVENOUS

## 2024-06-23 MED ORDER — SODIUM CHLORIDE 0.9 % IV SOLN: INTRAVENOUS | Status: DC

## 2024-06-23 MED ORDER — GELATIN ABSORBABLE 12-7 MM EX MISC
1.0000 | Freq: Once | CUTANEOUS | Status: AC
Start: 2024-06-23 — End: 2024-06-23
  Administered 2024-06-23: 1 via TOPICAL

## 2024-06-23 NOTE — Procedures (Signed)
 Interventional Radiology Procedure Note  Procedure: Ultrasound guided left renal biopsy   Findings: Please refer to procedural dictation for full description. 18 ga core x2 from inferior left pole.  Gelfoam slurry needle track embolization.  Complications: None immediate  Estimated Blood Loss: < 5 ml  Recommendations: Strict 3 hour bedrest. Follow Pathology results.   Ester Sides, MD

## 2024-06-28 ENCOUNTER — Encounter (HOSPITAL_COMMUNITY): Payer: Self-pay

## 2024-06-29 LAB — SURGICAL PATHOLOGY

## 2024-07-20 ENCOUNTER — Ambulatory Visit (HOSPITAL_COMMUNITY)

## 2024-08-12 ENCOUNTER — Ambulatory Visit (HOSPITAL_COMMUNITY): Attending: Cardiology

## 2024-08-12 ENCOUNTER — Encounter (HOSPITAL_COMMUNITY): Payer: Self-pay | Admitting: Cardiology
